# Patient Record
Sex: Female | Born: 1975
Health system: Southern US, Community
[De-identification: ages and names within clinical notes are randomized; demographics above are authoritative.]

## PROBLEM LIST (undated history)

## (undated) DIAGNOSIS — R51 Headache: Secondary | ICD-10-CM

## (undated) DIAGNOSIS — J309 Allergic rhinitis, unspecified: Secondary | ICD-10-CM

## (undated) DIAGNOSIS — E039 Hypothyroidism, unspecified: Secondary | ICD-10-CM

## (undated) HISTORY — PX: OTHER SURGICAL HISTORY: SHX169

## (undated) HISTORY — PX: BREAST BIOPSY: SHX20

## (undated) HISTORY — DX: Headache: R51

## (undated) HISTORY — PX: RHINOPLASTY: SUR1284

## (undated) HISTORY — DX: Allergic rhinitis, unspecified: J30.9

## (undated) HISTORY — DX: Hypothyroidism, unspecified: E03.9

---

## 2007-11-19 ENCOUNTER — Encounter: Payer: Self-pay | Admitting: Internal Medicine

## 2007-12-18 LAB — CONVERTED CEMR LAB: Pap Smear: NEGATIVE

## 2008-03-09 ENCOUNTER — Encounter: Payer: Self-pay | Admitting: Internal Medicine

## 2008-03-09 LAB — CONVERTED CEMR LAB: TSH: 0.75 microintl units/mL

## 2008-05-18 ENCOUNTER — Encounter: Payer: Self-pay | Admitting: Internal Medicine

## 2008-06-29 ENCOUNTER — Encounter: Payer: Self-pay | Admitting: Internal Medicine

## 2008-06-30 ENCOUNTER — Encounter: Payer: Self-pay | Admitting: Internal Medicine

## 2008-11-15 ENCOUNTER — Encounter: Payer: Self-pay | Admitting: Internal Medicine

## 2008-11-15 LAB — CONVERTED CEMR LAB: TSH: 2.62 microintl units/mL

## 2008-11-16 ENCOUNTER — Encounter: Payer: Self-pay | Admitting: Internal Medicine

## 2008-12-28 ENCOUNTER — Encounter: Payer: Self-pay | Admitting: Internal Medicine

## 2008-12-28 LAB — CONVERTED CEMR LAB: TSH: 0.53 microintl units/mL

## 2009-05-02 ENCOUNTER — Encounter: Payer: Self-pay | Admitting: Internal Medicine

## 2009-05-03 ENCOUNTER — Encounter: Payer: Self-pay | Admitting: Internal Medicine

## 2010-02-07 ENCOUNTER — Ambulatory Visit: Payer: Self-pay | Admitting: Internal Medicine

## 2010-02-07 DIAGNOSIS — E039 Hypothyroidism, unspecified: Secondary | ICD-10-CM | POA: Insufficient documentation

## 2010-02-07 LAB — CONVERTED CEMR LAB: TSH: 0.41 microintl units/mL (ref 0.35–5.50)

## 2010-02-14 ENCOUNTER — Encounter: Payer: Self-pay | Admitting: Internal Medicine

## 2010-02-14 DIAGNOSIS — J309 Allergic rhinitis, unspecified: Secondary | ICD-10-CM | POA: Insufficient documentation

## 2010-10-06 ENCOUNTER — Telehealth: Payer: Self-pay | Admitting: Internal Medicine

## 2010-12-12 ENCOUNTER — Ambulatory Visit: Payer: Self-pay | Admitting: Internal Medicine

## 2010-12-13 LAB — CONVERTED CEMR LAB
ALT: 23 units/L (ref 0–35)
AST: 22 units/L (ref 0–37)
Albumin: 4.1 g/dL (ref 3.5–5.2)
Alkaline Phosphatase: 34 units/L — ABNORMAL LOW (ref 39–117)
Basophils Relative: 0.7 % (ref 0.0–3.0)
Bilirubin, Direct: 0.1 mg/dL (ref 0.0–0.3)
CO2: 28 meq/L (ref 19–32)
Calcium: 9.5 mg/dL (ref 8.4–10.5)
Chloride: 103 meq/L (ref 96–112)
Eosinophils Absolute: 0.2 10*3/uL (ref 0.0–0.7)
Glucose, Bld: 81 mg/dL (ref 70–99)
HDL: 54.6 mg/dL (ref >39.00)
Hemoglobin: 13.7 g/dL (ref 12.0–15.0)
Lymphs Abs: 2 10*3/uL (ref 0.7–4.0)
MCHC: 34 g/dL (ref 30.0–36.0)
MCV: 91 fL (ref 78.0–100.0)
Monocytes Absolute: 0.3 10*3/uL (ref 0.1–1.0)
Neutro Abs: 3.4 10*3/uL (ref 1.4–7.7)
RBC: 4.42 M/uL (ref 3.87–5.11)
Total CHOL/HDL Ratio: 3
Total Protein: 6.8 g/dL (ref 6.0–8.3)

## 2010-12-17 HISTORY — PX: RHINOPLASTY: SUR1284

## 2011-01-16 NOTE — Letter (Signed)
Summary: [facility]  [facility]   Imported By: Sherian Rein 02/17/2010 08:21:07  _____________________________________________________________________  External Attachment:    Type:   Image     Comment:   External Document

## 2011-01-16 NOTE — Letter (Signed)
Summary: [facility]  [facility]   Imported By: Sherian Rein 02/17/2010 08:20:13  _____________________________________________________________________  External Attachment:    Type:   Image     Comment:   External Document

## 2011-01-16 NOTE — Assessment & Plan Note (Signed)
Summary: NEW/ [employer] / NWS   Vital Signs:  Patient profile:   35 year old female Height:      62.5 inches (158.75 cm) Weight:      103.8 pounds (47.18 kg) BMI:     18.75 O2 Sat:      98 % on Room air Temp:     98.8 degrees F (37.11 degrees C) oral Pulse rate:   84 / minute BP sitting:   90 / 62  (left arm) Cuff size:   regular  Vitals Entered By: Orlan Leavens (February 07, 2010 8:40 AM)  O2 Flow:  Room air CC: New patient Is Patient Diabetic? No Pain Assessment Patient in pain? no        Primary Care Provider:  Newt Lukes MD  CC:  New patient.  History of Present Illness: new pt to me and our division - here to est care  hx hypothyroidism -  onset was 2009 with pregnancy - followed with endo for same in [place] but moved to [place] last year and not checked since - symptoms have been stable on current med dose - compliant as rx'd no diarrhea or constipation, no weight changes or fatigue denies hair or skin change  Preventive Screening-Counseling & Management  Alcohol-Tobacco     Alcohol drinks/day: 0     Alcohol Counseling: not indicated; patient does not drink     Smoking Status: never     Tobacco Counseling: not indicated; no tobacco use  Caffeine-Diet-Exercise     Does Patient Exercise: yes     Exercise Counseling: not indicated; exercise is adequate     Depression Counseling: not indicated; screening negative for depression  Safety-Violence-Falls     Seat Belt Use: yes     Helmet Use: yes     Firearms in the Home: no firearms in the home     Smoke Detectors: yes     Violence in the Home: no risk noted     Sexual Abuse: no     Fall Risk Counseling: not indicated; no significant falls noted  Clinical Review Panels:  Prevention   Last Pap Smear:  Interpretation/Result:Negative for intraepithelial Lesion or Malignancy.    (12/18/2007)   Current Medications (verified): 1)  Synthroid 88 Mcg Tabs (Levothyroxine Sodium) .... Take 1 By Mouth Qd 2)   Multivitamins  Tabs (Multiple Vitamin) .... Take 1 By Mouth Qd 3)  Calcium 500 Mg Tabs (Calcium) .... Take 1 Every Other Day  Allergies (verified): No Known Drug Allergies  Past History:  Past Medical History: Hypothyroidism  Past Surgical History: child birth x's 2 (06 & 09)  Family History: Family History High cholesterol (mother) Family History Hypertension (mother) Heart disease: Dad with A. 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    Physical Exam  General:  thin, fit, alert, well-developed, well-nourished, and cooperative to examination.    Eyes:  vision grossly intact; pupils equal, round and reactive to light.  conjunctiva and lids normal.    Ears:  normal pinnae bilaterally, without erythema, swelling, or tenderness to palpation. TMs clear, without effusion, or cerumen impaction. Hearing grossly normal bilaterally  Mouth:  teeth and gums in good repair; mucous membranes  moist, without lesions or ulcers. oropharynx clear without exudate, no erythema.  Neck:  supple, full ROM, no masses, no thyromegaly; no thyroid nodules or tenderness. no JVD or carotid bruits.   Lungs:  normal respiratory effort, no intercostal retractions or use of accessory muscles; normal breath sounds bilaterally - no crackles and no wheezes.    Heart:  normal rate, regular rhythm, no murmur, and no rub. BLE without edema. Psych:  Oriented X3, memory intact for recent and remote, normally interactive, good eye contact, not anxious appearing, not depressed appearing, and not agitated.      Impression & Recommendations:  Problem # 1:  HYPOTHYROIDISM (ICD-244.9)  Her updated medication list for this problem  includes:    Synthroid 88 Mcg Tabs (Levothyroxine sodium) .Marland Kitchen... Take 1 by mouth qd  Orders: TLB-TSH (Thyroid Stimulating Hormone) (84443-TSH)  Complete Medication List: 1)  Synthroid 88 Mcg Tabs (Levothyroxine sodium) .... Take 1 by mouth qd 2)  Multivitamins Tabs (Multiple vitamin) .... Take 1 by mouth qd 3)  Calcium 500 Mg Tabs (Calcium) .... Take 1 every other day  Patient Instructions: 1)  it was good to see you today.  2)  will check TSH today - if normal range, will send year supply synthroid to your pharmacy. If abnormal range, will contact you directly 3)  will send for records from endo at [facility] as discussed - 4)  Please schedule a follow-up appointment in 6-12 months to monitor TSH, sooner if problems.    Pap Smear  Procedure date:  12/18/2007  Findings:      Interpretation/Result:Negative for intraepithelial Lesion or Malignancy.

## 2011-01-16 NOTE — Letter (Signed)
Summary: [facility]   Imported By: Sherian Rein 02/17/2010 08:18:41  _____________________________________________________________________  External Attachment:    Type:   Image     Comment:   External Document

## 2011-01-16 NOTE — Progress Notes (Signed)
Summary: levothyroxine  Phone Note Refill Request Message from:  Fax from Pharmacy on October 06, 2010 8:59 AM  Refills Requested: Medication #1:  SYNTHROID 88 MCG TABS take 1 by mouth qd   Last Refilled: 10/06/2010 Karin Golden @ [place]  Initial call taken by: Orlan Leavens RMA,  October 06, 2010 8:59 AM    Prescriptions: SYNTHROID 88 MCG TABS (LEVOTHYROXINE SODIUM) take 1 by mouth qd  #90 x 0   Entered by:   Orlan Leavens RMA   Authorized by:   Newt Lukes MD   Signed by:   Orlan Leavens RMA on 10/06/2010   Method used:   Electronically to        [facility]* (retail)       [address]       [place], [place]  [address]       Ph: [phone]       Fax: [phone]   RxID:   [id number]

## 2011-01-16 NOTE — Letter (Signed)
Summary: [facility]  [facility]   Imported By: Sherian Rein 02/17/2010 08:22:26  _____________________________________________________________________  External Attachment:    Type:   Image     Comment:   External Document

## 2011-01-18 NOTE — Assessment & Plan Note (Signed)
Summary: follow up to refill rx-lb   Vital Signs:  Patient profile:   35 year old female Height:      62.5 inches (158.75 cm) Weight:      105.2 pounds (47.82 kg) BMI:     19.00 O2 Sat:      95 % on Room air Temp:     98.6 degrees F (37.00 degrees C) oral Pulse rate:   69 / minute BP sitting:   100 / 62  (left arm) Cuff size:   regular  Vitals Entered By: Orlan Leavens RMA (December 12, 2010 11:26 AM)  O2 Flow:  Room air CC: follow-up visit Is Patient Diabetic? No Pain Assessment Patient in pain? no        Primary Care Blayden Conwell:  Newt Lukes MD  CC:  follow-up visit.  History of Present Illness: patient is here today for annual physical. Patient feels well and has no complaints.  fasting for l;abs  also review chronic med issues: hx hypothyroid -  onset was 2009 with pregnancy - followed with endo for same in [place] in 2010 - symptoms have been stable on current med dose - compliant as rx'd no diarrhea, but ++ constipation, no weight changes or fatigue denies hair or skin change  Preventive Screening-Counseling & Management  Alcohol-Tobacco     Alcohol drinks/day: 0     Alcohol Counseling: not indicated; patient does not drink     Smoking Status: never     Tobacco Counseling: not indicated; no tobacco use  Caffeine-Diet-Exercise     Does Patient Exercise: yes     Exercise Counseling: not indicated; exercise is adequate     Depression Counseling: not indicated; screening negative for depression  Safety-Violence-Falls     Seat Belt Use: yes     Helmet Use: yes     Firearms in the Home: no firearms in the home     Smoke Detectors: yes     Violence in the Home: no risk noted     Sexual Abuse: no     Fall Risk Counseling: not indicated; no significant falls noted  Clinical Review Panels:  Prevention   Last Pap Smear:  Interpretation/Result:Negative for intraepithelial Lesion or Malignancy.    (12/18/2007)   Current Medications (verified): 1)   Synthroid 88 Mcg Tabs (Levothyroxine Sodium) .... Take 1 By Mouth Once Daily, But On Sun Take 1 Extra in Pm 2)  Multivitamins  Tabs (Multiple Vitamin) .... Take 1 By Mouth Qd 3)  Calcium 500 Mg Tabs (Calcium) .... Take 2 By Mouth Once Daily 4)  Miralax  Powd (Polyethylene Glycol 3350) .... Take 17gm Once Daily 5)  Excedrin Migraine 250-250-65 Mg Tabs (Aspirin-Acetaminophen-Caffeine) .... Use As Needed  Allergies (verified): No Known Drug Allergies  Past History:  Past medical, surgical, family and social histories (including risk factors) reviewed, and no changes noted (except as noted below).  Past Medical History: Hypothyroidism - hashimotos hx Allergic rhinitis  Past Surgical History: Reviewed history from 02/07/2010 and no changes required. child birth x's 2 (06 & 09)  Family History: Reviewed history from 02/07/2010 and no changes required. Family History High cholesterol (mother) Family History Hypertension (mother) Heart disease: Dad with A. Fib and RVR - mom - chol, HTN  Social History: Reviewed history from 02/07/2010 and no changes required. 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  also see HPI above. I have reviewed all other systems and they were negative.   Physical Exam  General:  thin, fit, alert, well-developed, well-nourished, and cooperative to examination.    Head:  Normocephalic and atraumatic without obvious abnormalities. No apparent alopecia or balding. Eyes:  vision grossly intact; pupils equal, round and reactive to light.  conjunctiva and lids normal.    Ears:  normal pinnae bilaterally, without erythema, swelling, or tenderness to palpation. TMs clear, without effusion, or cerumen impaction. Hearing grossly normal bilaterally  Mouth:  teeth and gums in good repair; mucous membranes moist, without lesions or  ulcers. oropharynx clear without exudate, no erythema.  Neck:  supple, full ROM, no masses, no thyromegaly; no thyroid nodules or tenderness. no JVD or carotid bruits.   Lungs:  normal respiratory effort, no intercostal retractions or use of accessory muscles; normal breath sounds bilaterally - no crackles and no wheezes.    Heart:  normal rate, regular rhythm, no murmur, and no rub. BLE without edema. Abdomen:  soft, non-tender, normal bowel sounds, no distention; no masses and no appreciable hepatomegaly or splenomegaly.   Genitalia:  defer gyn Msk:  No deformity or scoliosis noted of thoracic or lumbar spine.   Neurologic:  alert & oriented X3 and cranial nerves II-XII symetrically intact.  strength normal in all extremities, sensation intact to light touch, and gait normal. speech fluent without dysarthria or aphasia; follows commands with good comprehension.  Skin:  no rashes, vesicles, ulcers, or erythema. No nodules or irregularity to palpation.  Psych:  Oriented X3, memory intact for recent and remote, normally interactive, good eye contact, not anxious appearing, not depressed appearing, and not agitated.      Impression & Recommendations:  Problem # 1:  PREVENTIVE HEALTH CARE (ICD-V70.0) Patient has been counseled on age-appropriate routine health concerns for screening and prevention. These are reviewed and up-to-date. Immunizations are up-to-date or declined. Labs ordered and will be reviewed.  Orders: TLB-CBC Platelet - w/Differential (85025-CBCD) TLB-BMP (Basic Metabolic Panel-BMET) (80048-METABOL) TLB-Hepatic/Liver Function Pnl (80076-HEPATIC) TLB-Lipid Panel (80061-LIPID)  Problem # 2:  HYPOTHYROIDISM (ICD-244.9)  reports inc dose over summer by her [facility] endo based on symptoms (heat sens and constipation) recheck labs now - refill at current dose if normal labs Her updated medication list for this problem includes:    Synthroid 100 Mcg Tabs (Levothyroxine sodium) .Marland Kitchen... 1 by  mouth once daily  Orders: TLB-TSH (Thyroid Stimulating Hormone) (84443-TSH) TLB-T4 (Thyrox), Free [phone])  Labs Reviewed: TSH: 0.41 (02/07/2010)     Complete Medication List: 1)  Synthroid 100 Mcg Tabs (Levothyroxine sodium) .Marland Kitchen.. 1 by mouth once daily 2)  Multivitamins Tabs (Multiple vitamin) .... Take 1 by mouth qd 3)  Calcium 500 Mg Tabs (Calcium) .... Take 2 by mouth once daily 4)  Miralax Powd (Polyethylene glycol 3350) .... Take 17gm once daily 5)  Excedrin Migraine 250-250-65 Mg Tabs (Aspirin-acetaminophen-caffeine) .... Use as needed 6)  Align Caps (Probiotic product) .Marland Kitchen.. 1 by mouth once daily  Patient Instructions: 1)  it was good to see you today. 2)  test(s) ordered today - your results will be posted on the phone tree for review in 48-72 hours from the time of test completion; call [phone] and enter your 9 digit MRN (listed above on this page, just below your name); if any changes need to be made or there are abnormal results, you will be contacted directly. 3)  let us know if you need referral to gynecology for PAP in next 12months  4)  if thyroid labs normal, will send refills to [facility] pharmacy as requested 5)  try Align to help with constipation - 7d sample and coupon provided today 6)  Please schedule a follow-up appointment in 6 months to monitor thyroid, call sooner if problems.    Orders Added: 1)  TLB-TSH (Thyroid Stimulating Hormone) [84443-TSH] 2)  TLB-T4 (Thyrox), Free [16109-UE4V] 3)  TLB-CBC Platelet - w/Differential [85025-CBCD] 4)  TLB-BMP (Basic Metabolic Panel-BMET) [80048-METABOL] 5)  TLB-Hepatic/Liver Function Pnl [80076-HEPATIC] 6)  TLB-Lipid Panel [80061-LIPID] 7)  Est. Patient 18-39 years [99395] 8)  Est. Patient Level II [40981]

## 2011-01-24 ENCOUNTER — Other Ambulatory Visit: Payer: Managed Care, Other (non HMO)

## 2011-01-24 ENCOUNTER — Other Ambulatory Visit: Payer: Self-pay | Admitting: Internal Medicine

## 2011-01-24 ENCOUNTER — Ambulatory Visit (INDEPENDENT_AMBULATORY_CARE_PROVIDER_SITE_OTHER): Payer: Managed Care, Other (non HMO) | Admitting: Internal Medicine

## 2011-01-24 ENCOUNTER — Encounter: Payer: Self-pay | Admitting: Internal Medicine

## 2011-01-24 ENCOUNTER — Ambulatory Visit: Payer: Self-pay | Admitting: Internal Medicine

## 2011-01-24 DIAGNOSIS — R51 Headache: Secondary | ICD-10-CM

## 2011-01-24 DIAGNOSIS — R519 Headache, unspecified: Secondary | ICD-10-CM | POA: Insufficient documentation

## 2011-01-24 LAB — SEDIMENTATION RATE: Sed Rate: 9 mm/hr (ref 0–22)

## 2011-01-26 ENCOUNTER — Encounter: Payer: Self-pay | Admitting: Internal Medicine

## 2011-01-29 ENCOUNTER — Other Ambulatory Visit: Payer: Self-pay | Admitting: Internal Medicine

## 2011-01-29 DIAGNOSIS — R51 Headache: Secondary | ICD-10-CM

## 2011-02-01 ENCOUNTER — Other Ambulatory Visit (HOSPITAL_COMMUNITY): Payer: Managed Care, Other (non HMO)

## 2011-02-01 NOTE — Assessment & Plan Note (Signed)
Summary: headaches   Vital Signs:  Patient profile:   35 year old female Height:      62.5 inches (158.75 cm) O2 Sat:      96 % on Room air Temp:     98.4 degrees F (36.89 degrees C) oral Pulse rate:   69 / minute BP sitting:   100 / 60  (left arm) Cuff size:   regular  Vitals Entered By: Orlan Leavens RMA (January 24, 2011 9:16 AM)  O2 Flow:  Room air CC: Headaches Is Patient Diabetic? No Pain Assessment Patient in pain? no        Primary Care Provider:  Newt Lukes MD  CC:  Headaches.  History of Present Illness:  Headaches      This is a 35 year old woman who presents with Headaches.  The symptoms began >12 months ago.  On a scale of 1 to 10, the intensity is described as a 5.  different than usual migraine headaches. present every AM upon waking.  The patient denies nausea, vomiting, sweats, tearing of eyes, nasal congestion, sinus pain, sinus pressure, photophobia, and phonophobia.  The headache is described as intermittent and dull.  The location of the pain is unilateral on the left.  The patient denies the following high-risk features: fever, neck pain/stiffness, vision loss or change, focal weakness, altered mental status, rash, trauma, pain worse with exertion, age >35 years, and immunosuppression.  The headaches are precipitated by stress.  Prior treatment has included a NSAID and a muscle relaxer.     also reviewed chronic med issues: hx hypothyroid -  onset was 2009 with pregnancy - followed with endo for same in [place] in 2010 - symptoms have been stable on current med dose - compliant as rx'd no diarrhea, but ++ constipation, no weight changes or fatigue denies hair or skin change  Preventive Screening-Counseling & Management  Alcohol-Tobacco     Alcohol drinks/day: 0     Alcohol Counseling: not indicated; patient does not drink     Smoking Status: never     Tobacco Counseling: not indicated; no tobacco use  Caffeine-Diet-Exercise     Does Patient  Exercise: yes     Exercise Counseling: not indicated; exercise is adequate     Depression Counseling: not indicated; screening negative for depression  Clinical Review Panels:  CBC   WBC:  6.0 (12/12/2010)   RBC:  4.42 (12/12/2010)   Hgb:  13.7 (12/12/2010)   Hct:  40.2 (12/12/2010)   Platelets:  306.0 (12/12/2010)   MCV  91.0 (12/12/2010)   MCHC  34.0 (12/12/2010)   RDW  12.8 (12/12/2010)   PMN:  56.5 (12/12/2010)   Lymphs:  33.1 (12/12/2010)   Monos:  5.6 (12/12/2010)   Eosinophils:  4.1 (12/12/2010)   Basophil:  0.7 (12/12/2010)  Complete Metabolic Panel   Glucose:  81 (12/12/2010)   Sodium:  138 (12/12/2010)   Potassium:  4.4 (12/12/2010)   Chloride:  103 (12/12/2010)   CO2:  28 (12/12/2010)   BUN:  16 (12/12/2010)   Creatinine:  0.7 (12/12/2010)   Albumin:  4.1 (12/12/2010)   Total Protein:  6.8 (12/12/2010)   Calcium:  9.5 (12/12/2010)   Total Bili:  0.6 (12/12/2010)   Alk Phos:  34 (12/12/2010)   SGPT (ALT):  23 (12/12/2010)   SGOT (AST):  22 (12/12/2010)   Current Medications (verified): 1)  Synthroid 100 Mcg Tabs (Levothyroxine Sodium) .Marland Kitchen.. 1 By Mouth Once Daily 2)  Multivitamins  Tabs (  Multiple Vitamin) .... Take 1 By Mouth Qd 3)  Calcium 500 Mg Tabs (Calcium) .... Take 2 By Mouth Once Daily 4)  Miralax  Powd (Polyethylene Glycol 3350) .... Take 17gm Once Daily 5)  Excedrin Migraine 250-250-65 Mg Tabs (Aspirin-Acetaminophen-Caffeine) .... Take 2 By Mouth Once Daily 6)  Align  Caps (Probiotic Product) .Marland Kitchen.. 1 By Mouth Once Daily 7)  Ibuprofen 200 Mg Caps (Ibuprofen) .... Take 1 By Mouth Once Daily  Allergies (verified): No Known Drug Allergies  Past History:  Past Medical History: Hypothyroidism - hashimotos hx Allergic rhinitis  Review of Systems  The patient denies weight loss, vision loss, chest pain, dyspnea on exertion, hemoptysis, incontinence, muscle weakness, suspicious skin lesions, and enlarged lymph nodes.    Physical Exam  General:   thin, fit, alert, well-developed, well-nourished, and cooperative to examination.    Lungs:  normal respiratory effort, no intercostal retractions or use of accessory muscles; normal breath sounds bilaterally - no crackles and no wheezes.    Heart:  normal rate, regular rhythm, no murmur, and no rub. BLE without edema. Neurologic:  alert & oriented X3 and cranial nerves II-XII symetrically intact.  strength normal in all extremities, sensation intact to light touch, and gait normal. speech fluent without dysarthria or aphasia; follows commands with good comprehension.    Impression & Recommendations:  Problem # 1:  HEADACHE (ICD-784.0)  suspect stress/tension related - prev trial tx robaxin ineffective - different than usual migrain symptoms - relieved with NSAIDs - ?rebound complications - neuro exam benign but check head ct r/o ic abn - also sed rate now - other cpx labs reviewed and normal  Her updated medication list for this problem includes:    Excedrin Migraine 250-250-65 Mg Tabs (Aspirin-acetaminophen-caffeine) .Marland Kitchen... Take 2 by mouth once daily    Ibuprofen 200 Mg Caps (Ibuprofen) .Marland Kitchen... Take 1 by mouth once daily  Orders: Misc. Referral (Misc. Ref) TLB-Sedimentation Rate (ESR) (85652-ESR)  Headache diary reviewed.  Complete Medication List: 1)  Synthroid 100 Mcg Tabs (Levothyroxine sodium) .Marland Kitchen.. 1 by mouth once daily 2)  Multivitamins Tabs (Multiple vitamin) .... Take 1 by mouth qd 3)  Calcium 500 Mg Tabs (Calcium) .... Take 2 by mouth once daily 4)  Miralax Powd (Polyethylene glycol 3350) .... Take 17gm once daily 5)  Excedrin Migraine 250-250-65 Mg Tabs (Aspirin-acetaminophen-caffeine) .... Take 2 by mouth once daily 6)  Align Caps (Probiotic product) .Marland Kitchen.. 1 by mouth once daily 7)  Ibuprofen 200 Mg Caps (Ibuprofen) .... Take 1 by mouth once daily  Patient Instructions: 1)  it was good to see you today. 2)  we'll make referral for head ct. Our office will contact you  regarding this appointment once made.  3)  test(s) ordered today - your results will be posted on the phone tree for review in 48-72 hours from the time of test completion; call [phone] and enter your 9 digit MRN (listed above on this page, just below your name); if any changes need to be made or there are abnormal results, you will be contacted directly.  4)  Please keep scheduled follow-up appointment for thyroid check, sooner if problems.    Orders Added: 1)  Misc. Referral [Misc. Ref] 2)  TLB-Sedimentation Rate (ESR) [85652-ESR] 3)  Est. Patient Level IV [09811]

## 2011-02-02 ENCOUNTER — Encounter: Payer: Self-pay | Admitting: Internal Medicine

## 2011-02-02 ENCOUNTER — Other Ambulatory Visit: Payer: Self-pay | Admitting: Internal Medicine

## 2011-02-02 ENCOUNTER — Ambulatory Visit (HOSPITAL_COMMUNITY)
Admission: RE | Admit: 2011-02-02 | Discharge: 2011-02-02 | Disposition: A | Discharge status: Home / Self Care | Payer: Managed Care, Other (non HMO) | Source: Ambulatory Visit | Attending: Internal Medicine | Admitting: Internal Medicine

## 2011-02-02 DIAGNOSIS — R51 Headache: Secondary | ICD-10-CM | POA: Insufficient documentation

## 2011-02-02 DIAGNOSIS — J3489 Other specified disorders of nose and nasal sinuses: Secondary | ICD-10-CM | POA: Insufficient documentation

## 2011-02-06 ENCOUNTER — Telehealth: Payer: Self-pay | Admitting: Internal Medicine

## 2011-02-07 NOTE — Medication Information (Signed)
[facility]: [facility]   Imported By: Lester Eden 01/31/2011 10:56:18  _____________________________________________________________________  External Attachment:    Type:   Image     Comment:   External Document

## 2011-02-09 ENCOUNTER — Telehealth (INDEPENDENT_AMBULATORY_CARE_PROVIDER_SITE_OTHER): Payer: Self-pay | Admitting: *Deleted

## 2011-02-13 NOTE — Progress Notes (Signed)
  Phone Note Other Incoming   Request: Send information Summary of Call: Request for records received from [facility]. Request forwarded to [facility]. 5 yrs.

## 2011-02-13 NOTE — Progress Notes (Signed)
Summary: CT Head w/o contrast  Phone Note Outgoing Call   Call placed by: Orlan Leavens RMA,  February 06, 2011 8:51 AM Call placed to: Patient Summary of Call: MD recieved CT Head report. Per md no incranial abnormalties seen on CT Head contrast. No med changes at this time. called pt to inform did not pick up [facility] RTC concerning CT Initial call taken by: Orlan Leavens RMA,  February 06, 2011 8:52 AM  Follow-up for Phone Call        Notified pt with results. Sending to be scanned Follow-up by: Orlan Leavens RMA,  February 06, 2011 4:08 PM

## 2011-06-19 ENCOUNTER — Emergency Department (HOSPITAL_COMMUNITY)
Admission: EM | Admit: 2011-06-19 | Discharge: 2011-06-19 | Disposition: A | Discharge status: Home / Self Care | Payer: Managed Care, Other (non HMO) | Attending: Emergency Medicine | Admitting: Emergency Medicine

## 2011-06-19 DIAGNOSIS — R197 Diarrhea, unspecified: Secondary | ICD-10-CM | POA: Insufficient documentation

## 2011-06-19 DIAGNOSIS — E860 Dehydration: Secondary | ICD-10-CM | POA: Insufficient documentation

## 2011-06-19 DIAGNOSIS — E039 Hypothyroidism, unspecified: Secondary | ICD-10-CM | POA: Insufficient documentation

## 2011-07-31 ENCOUNTER — Encounter: Payer: Self-pay | Admitting: Internal Medicine

## 2011-10-04 ENCOUNTER — Other Ambulatory Visit: Payer: Self-pay | Admitting: Internal Medicine

## 2011-11-15 ENCOUNTER — Other Ambulatory Visit (INDEPENDENT_AMBULATORY_CARE_PROVIDER_SITE_OTHER): Payer: Managed Care, Other (non HMO)

## 2011-11-15 ENCOUNTER — Encounter: Payer: Self-pay | Admitting: Internal Medicine

## 2011-11-15 ENCOUNTER — Ambulatory Visit (INDEPENDENT_AMBULATORY_CARE_PROVIDER_SITE_OTHER): Payer: Managed Care, Other (non HMO) | Admitting: Internal Medicine

## 2011-11-15 ENCOUNTER — Other Ambulatory Visit: Payer: Self-pay | Admitting: Internal Medicine

## 2011-11-15 DIAGNOSIS — R05 Cough: Secondary | ICD-10-CM

## 2011-11-15 DIAGNOSIS — E039 Hypothyroidism, unspecified: Secondary | ICD-10-CM

## 2011-11-15 DIAGNOSIS — R059 Cough, unspecified: Secondary | ICD-10-CM

## 2011-11-15 MED ORDER — HYDROCOD POLST-CHLORPHEN POLST 10-8 MG/5ML PO LQCR: 5.0000 mL | Freq: Every evening | ORAL | Status: DC | PRN

## 2011-11-15 MED ORDER — LEVOTHYROXINE SODIUM 88 MCG PO TABS: 88.0000 ug | ORAL_TABLET | ORAL | Status: DC

## 2011-11-15 MED ORDER — BENZONATATE 200 MG PO CAPS: 200.0000 mg | ORAL_CAPSULE | Freq: Three times a day (TID) | ORAL | Status: AC | PRN

## 2011-11-15 MED ORDER — LEVOTHYROXINE SODIUM 100 MCG PO TABS: 100.0000 ug | ORAL_TABLET | Freq: Every day | ORAL | Status: DC

## 2011-11-15 NOTE — Assessment & Plan Note (Signed)
The current medical regimen is effective;  continue present plan and medications. Lab Results  Component Value Date   TSH 0.17* 12/12/2010

## 2011-11-15 NOTE — Patient Instructions (Signed)
It was good to see you today. Test(s) ordered today. Your results will be called to you after review (48-72hours after test completion). If any changes need to be made, you will be notified at that time. Use Tussionex at night, Tessalon during day and Pepcid every day for 2 weeks to help suppress cough symptoms If you develop worsening symptoms or fever, call and we can reconsider antibiotics, but it does not appear necessary to use antibiotics at this time. Please schedule followup in 6-12 months for thyroid recheck, call sooner if problems.

## 2011-11-15 NOTE — Progress Notes (Signed)
  Subjective:    Patient ID: Cynthia Ramsey, female    DOB: [birth year], 35 y.o.   MRN: [id number]  HPI  Here for follow up - reviewed chronic medical issues:   hypothyroid -  onset was 2009 with pregnancy -  followed with endo for same in [place] in 2010 -  symptoms have been stable on current med dose - compliant as rx'd  no diarrhea, but ++ constipation, no weight changes or fatigue  denies hair or skin change   Complains of ongoing cough, nonproductive Precipitated by URI Not relieved with Mucinex or other over-the-counter medications No fever, no shortness of breath Taking antibiotics (ofloxacin) from [place] at this time  Past Medical History  Diagnosis Date  . ALLERGIC RHINITIS   . Headache   . HYPOTHYROIDISM     Hashimoto's history    Review of Systems  Constitutional: Negative for fever and unexpected weight change.  Respiratory: Negative for chest tightness and shortness of breath.   Cardiovascular: Negative for palpitations and leg swelling.       Objective:   Physical Exam BP 100/60  Pulse 75  Temp(Src) 98.5 F (36.9 C) (Oral)  Ht 5\' 2"  (1.575 m)  Wt 108 lb 6.4 oz (49.17 kg)  BMI 19.83 kg/m2  SpO2 97% Wt Readings from Last 3 Encounters:  11/15/11 108 lb 6.4 oz (49.17 kg)  12/12/10 105 lb 3.2 oz (47.718 kg)  02/07/10 103 lb 12.8 oz (47.083 kg)   Constitutional: She appears well-developed and well-nourished. No distress.  Neck: Normal range of motion. Neck supple. No JVD present. No thyromegaly present.  Cardiovascular: Normal rate, regular rhythm and normal heart sounds.  No murmur heard. No BLE edema. Pulmonary/Chest: Effort normal and breath sounds normal. No respiratory distress. She has no wheezes.  Skin: Skin is warm and dry. No rash noted. No erythema.  Psychiatric: She has a normal mood and affect. Her behavior is normal. Judgment and thought content normal.    Lab Results  Component Value Date   WBC 6.0 12/12/2010   HGB 13.7 12/12/2010   HCT  40.2 12/12/2010   PLT 306.0 12/12/2010   GLUCOSE 81 12/12/2010   CHOL 178 12/12/2010   TRIG 57.0 12/12/2010   HDL 54.60 12/12/2010   LDLCALC 112* 12/12/2010   ALT 23 12/12/2010   AST 22 12/12/2010   NA 138 12/12/2010   K 4.4 12/12/2010   CL 103 12/12/2010   CREATININE 0.7 12/12/2010   BUN 16 12/12/2010   CO2 28 12/12/2010   TSH 0.17* 12/12/2010        Assessment & Plan:  See problem list. Medications and labs reviewed today.  Cough, post viral. No role for antibiotics indicated. Will treat with aggressive cough suppression including H2 blocker, Tessalon and Tessalon syrup at night. Exam and vital signs benign. Patient to call if symptoms unimproved or worse in the next 3 weeks

## 2011-11-25 ENCOUNTER — Telehealth: Payer: Self-pay | Admitting: Family Medicine

## 2011-11-25 NOTE — Telephone Encounter (Signed)
[facility] called to report pt has 24h hx of f/c achiness, cough--essentially flu-like symptoms.  Pt calling to request tamiflu rx. Pt is a hospitalist, has had recent frequent influenza + contact.  Currently doing appropriate home symptomatic care per nurse. I did authorize rx for Tamiflu 75mg  caps, 1 bid x 5d, #10, no RF.

## 2011-11-26 ENCOUNTER — Telehealth: Payer: Self-pay

## 2011-11-26 NOTE — Telephone Encounter (Signed)
[facility] Triage Call Report Triage Record Num: [id number] Operator: Baldomero Lamy Patient Name: Cynthia Ramsey Call Date & Time: 11/25/2011 9:56:57AM Patient Phone: PCP: Rene Paci Patient Gender: Female PCP Fax : [phone] Patient DOB: October 19, 1976 Practice Name: [facility] Reason for Call: Caller: Dr. Emiliano Dyer; PCP: Rene Paci; CB#: [phone]; Call Reason: Patient Exposed To Flu, Cough, Chills, Body Aches; Sx Onset: 11/24/2011; Sx Notes: ; Afebrile; Wt: ; Guideline Used: ; Disp:; Appt Scheduled?: Pt calling requesting Tamiflu. 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 SXS: body aches, chills, sore throat, cold sxs, fever-Onset 12/8 afternoon. Afebrile at the moment but comes and goes. Pt staying hydrated and resting. Tx with alternating 2 Tylenol ant 2 Ibuprofen Q 6-8 hrs. Emergent sxs of Flu-Like sxs r/o. Disp: Call Provider w/in 4 hrs for Flu-like sxs associated with a cough and breathing that is becoming more more difficult. Spoke with Dr. Milinda Cave; T.O. for Tamilflu 75 mg: 1 tab bid x 5 Days; no refills. Spoke with Brandy at [facility] [phone] Protocol(s) Used: Flu-Like Symptoms Recommended Outcome per Protocol: Call Provider within 4 Hours Reason for Outcome: Flu-like symptoms associated with a cough and breathing that is becoming more difficult Care Advice: ~ Use a cool mist humidifier to moisten air. Be sure to clean according to manufacturer's instructions. ~ Rest until symptoms improve. ~ Consider use of a saline nasal spray per package directions to help relieve nasal congestion. Aspirin should not be used in anyone, 35 years of age and under, for temperature control, pain, or aches when flu is a possibility. ~ ~ If you can, stop smoking now and avoid all secondhand smoke. ~ SYMPTOM / CONDITION MANAGEMENT ~ IMMEDIATE ACTION ~ INFECTION CONTROL ~ CAUTIONS Coughing up mucus or phlegm helps to  get rid of an infection. A productive cough should not be stopped. A cough medicine with guaifenesin (Robitussin, Mucinex) can help loosen the mucus. Cough medicine with dextromethorphan (DM) should be avoided. Drinking lots of fluids can help loosen the mucus too, especially warm fluids. ~ If your provider has not called back within 4 hours to discuss your symptoms and recommend the appropriate care for your situation, see a provider immediately. ~ ~ Follow public health advisories. Most adults need to drink 6-10 eight-ounce glasses (1.2-2.0 liters) of fluids per day unless previously told to limit fluid intake for other medical reasons. Limit fluids that contain caffeine, sugar or alcohol. Urine will be a very light yellow color when you drink enough fluids. ~ Analgesic/Antipyretic Advice - Acetaminophen: Consider acetaminophen as directed on label or by pharmacist/provider for pain or fever. PRECAUTIONS: - Use only if there is no history of liver disease, alcoholism, or intake of three or more alcohol drinks per day. - If approved by provider when breastfeeding. - Do not exceed recommended dose or frequency. ~ 11/25/2011 12:28:27PM Page 1 of 3 [id number] [facility] Triage Call Report Patient Name: Cynthia Ramsey continuation page/s Sore Throat Relief: - Use warm salt water gargles 3 to 4 times/day, as needed (1/2 tsp. salt in 8 oz. [.2 liters] water). - Suck on hard candy, nonprescription or herbal throat lozenges (sugar-free if diabetic) - Eat soothing, soft food/fluids (broths, soups, or honey and lemon juice in hot tea, Popsicles, frozen yogurt or sherbet, scrambled eggs, cooked cereals, Jell-O or puddings) whichever is most comforting. - Avoid eating salty, spicy or acidic foods. ~ Analgesic/Antipyretic Advice - NSAIDs: Consider aspirin, ibuprofen, naproxen or ketoprofen  for pain or fever as directed on label or by pharmacist/provider. PRECAUTIONS: - If over 12 years  of age, should not take longer than 1 week without consulting provider. EXCEPTIONS: - Should not be used if taking blood thinners or have bleeding problems. - Do not use if have history of sensitivity/allergy to any of these medications; or history of cardiovascular, ulcer, kidney, liver disease or diabetes unless approved by provider. - Do not exceed recommended dose or frequency. ~ See a provider immediately or go to the Emergency Department if having chest pain with breathing, change in level of consciousness, new seizure, vomiting and unable to keep fluids down for 8 hours or more, or has not urinated for 8 or more hours. ~ Influenza - Expected Course: - Symptoms start to improve in 3 to 7 days. - Cough and feeling tired (malaise) may continue for several weeks. - Cough and extreme fatigue lasting more than 3 weeks needs medical evaluation. - Remain at home at least 24 hours after being free of fever 100F (37.8C) without the use of fever reducing medication. ~ Influenza - Transmission: - Flu virus is spread through the air in droplets when a flu-infected person coughs, sneezes or talks and another person breathes in the droplets, or by touching a surface like a door knob, telephone, or keyboard that has been contaminated by the droplets and then touching the mouth, nose, or eyes. - An individual may be passing on the flu before they have any symptoms. - An individual may continue to be contagious with the flu for up to 7 days after the symptoms start. H1N1 influenza may continue to be contagious as long as cough persists. ~ Influenza Respiratory Hygiene: - Cover the nose/mouth tightly with a tissue when coughing or sneezing. - Use tissue one time and discard in the nearest waste receptacle. - Wash hands with soap and water or alcohol-based hand rub for at least 15 seconds after coming into contact

## 2011-11-26 NOTE — Telephone Encounter (Signed)
Noted. Thanks.

## 2012-01-23 ENCOUNTER — Encounter: Payer: Self-pay | Admitting: Internal Medicine

## 2012-01-23 ENCOUNTER — Ambulatory Visit (INDEPENDENT_AMBULATORY_CARE_PROVIDER_SITE_OTHER): Payer: Managed Care, Other (non HMO) | Admitting: Internal Medicine

## 2012-01-23 ENCOUNTER — Other Ambulatory Visit (INDEPENDENT_AMBULATORY_CARE_PROVIDER_SITE_OTHER): Payer: Managed Care, Other (non HMO)

## 2012-01-23 ENCOUNTER — Ambulatory Visit (INDEPENDENT_AMBULATORY_CARE_PROVIDER_SITE_OTHER)
Admission: RE | Admit: 2012-01-23 | Discharge: 2012-01-23 | Disposition: A | Discharge status: Home / Self Care | Payer: Managed Care, Other (non HMO) | Source: Ambulatory Visit | Attending: Internal Medicine | Admitting: Internal Medicine

## 2012-01-23 VITALS — BP 90/58 | HR 62 | Temp 97.3°F | Wt 111.0 lb

## 2012-01-23 DIAGNOSIS — E039 Hypothyroidism, unspecified: Secondary | ICD-10-CM

## 2012-01-23 DIAGNOSIS — F419 Anxiety disorder, unspecified: Secondary | ICD-10-CM

## 2012-01-23 DIAGNOSIS — R059 Cough, unspecified: Secondary | ICD-10-CM

## 2012-01-23 DIAGNOSIS — R0989 Other specified symptoms and signs involving the circulatory and respiratory systems: Secondary | ICD-10-CM

## 2012-01-23 DIAGNOSIS — R0600 Dyspnea, unspecified: Secondary | ICD-10-CM

## 2012-01-23 DIAGNOSIS — F411 Generalized anxiety disorder: Secondary | ICD-10-CM

## 2012-01-23 DIAGNOSIS — R05 Cough: Secondary | ICD-10-CM

## 2012-01-23 DIAGNOSIS — R0609 Other forms of dyspnea: Secondary | ICD-10-CM

## 2012-01-23 LAB — CBC WITH DIFFERENTIAL/PLATELET
Basophils Relative: 1.1 % (ref 0.0–3.0)
Eosinophils Absolute: 0.2 10*3/uL (ref 0.0–0.7)
Eosinophils Relative: 4 % (ref 0.0–5.0)
Hemoglobin: 13.1 g/dL (ref 12.0–15.0)
Lymphocytes Relative: 34.7 % (ref 12.0–46.0)
MCHC: 34 g/dL (ref 30.0–36.0)
MCV: 89.4 fl (ref 78.0–100.0)
Neutro Abs: 2.8 10*3/uL (ref 1.4–7.7)
RBC: 4.3 Mil/uL (ref 3.87–5.11)
WBC: 5.4 10*3/uL (ref 4.5–10.5)

## 2012-01-23 MED ORDER — IOHEXOL 300 MG/ML  SOLN
80.0000 mL | Freq: Once | INTRAMUSCULAR | Status: AC | PRN
  Administered 2012-01-23: 80 mL via INTRAVENOUS

## 2012-01-23 MED ORDER — LEVOTHYROXINE SODIUM 100 MCG PO TABS: 100.0000 ug | ORAL_TABLET | Freq: Every day | ORAL | Status: DC

## 2012-01-23 NOTE — Patient Instructions (Signed)
It was good to see you today. Test(s) ordered today. Your results will be called to you after review (48-72hours after test completion). If any changes need to be made, you will be notified at that time. we'll make referral for CT chest angio to exclude PE. Our office will contact you regarding appointment(s) once made. Once results reviewed, you will be called and we will determine next best med treatment for your cough and anxiety symptoms

## 2012-01-23 NOTE — Progress Notes (Signed)
  Subjective:    Patient ID: Cynthia Ramsey, female    DOB: [birth year], 36 y.o.   MRN: [id number]  HPI Complains of cough symptoms Precipitated by upper respiratory infection symptoms greater than one month ago Not improved by empiric antibiotic course x2 - Augmentin and floor quinolone therapy Also little relief with over-the-counter medications Cough associated with minimal sputum production, shortness of breath and fatigue Denies wheeze or history of asthma/pulmonary disease Periodic sore throat and chest tightness Denies fever or lower extremity swelling Concern about risk for blood clot given extensive travel overseas and within [place] over past 4 weeks  PMH reviewed and updated today  Review of Systems  Constitutional: Negative for chills and unexpected weight change.  HENT: Negative for sneezing, postnasal drip and sinus pressure.   Respiratory: Negative for apnea and stridor.   Cardiovascular: Negative for chest pain, palpitations and leg swelling.       Objective:   Physical Exam BP 90/58  Pulse 62  Temp(Src) 97.3 F (36.3 C) (Oral)  Wt 111 lb (50.349 kg)  SpO2 98% Wt Readings from Last 3 Encounters:  01/23/12 111 lb (50.349 kg)  11/15/11 108 lb 6.4 oz (49.17 kg)  12/12/10 105 lb 3.2 oz (47.718 kg)   Constitutional: She appears well-developed and well-nourished. No distress.  Neck: Normal range of motion. Neck supple. No JVD present. No thyromegaly present.  Cardiovascular: Normal rate, regular rhythm and normal heart sounds.  No murmur heard. No BLE edema. Pulmonary/Chest: Effort normal and breath sounds normal. No respiratory distress. She has no wheezes.  Psychiatric: She has an anxious mood and affect. Her behavior is normal. Judgment and thought content normal.   Lab Results  Component Value Date   WBC 6.0 12/12/2010   HGB 13.7 12/12/2010   HCT 40.2 12/12/2010   PLT 306.0 12/12/2010   GLUCOSE 81 12/12/2010   CHOL 178 12/12/2010   TRIG 57.0 12/12/2010   HDL 54.60 12/12/2010   LDLCALC 112* 12/12/2010   ALT 23 12/12/2010   AST 22 12/12/2010   NA 138 12/12/2010   K 4.4 12/12/2010   CL 103 12/12/2010   CREATININE 0.7 12/12/2010   BUN 16 12/12/2010   CO2 28 12/12/2010   TSH 0.12* 11/15/2011       Assessment & Plan:  Dyspnea with cough x4 weeks - question postinflammatory bronchitis following URI versus other Extensive airline travel over past month confers increased risk DVT Anxiety/PTSD -   Check CT angio chest rule out PE or other bronchitic/pulmonary disease process No evidence of active infection status post 2 rounds empiric antibiotics without improvement in symptoms Consider steroid treatment for postinflammatory process versus PFTs Cough suppression prescription if no evidence of DVT or infection on imaging Also check labs today

## 2012-01-23 NOTE — Assessment & Plan Note (Signed)
The current medical regimen is effective;  continue present plan and medications. Lab Results  Component Value Date   TSH 0.19* 01/23/2012    Addendum: Due to anxiety with slight depression of TSH, recommend slight decrease in TSH dosing and recheck in 6-12 weeks

## 2012-01-24 ENCOUNTER — Telehealth: Payer: Self-pay | Admitting: *Deleted

## 2012-01-24 ENCOUNTER — Other Ambulatory Visit: Payer: Self-pay | Admitting: Internal Medicine

## 2012-01-24 DIAGNOSIS — E039 Hypothyroidism, unspecified: Secondary | ICD-10-CM

## 2012-01-24 MED ORDER — HYDROCOD POLST-CHLORPHEN POLST 10-8 MG/5ML PO LQCR: 5.0000 mL | Freq: Every evening | ORAL | Status: DC | PRN

## 2012-01-24 MED ORDER — PREDNISONE (PAK) 10 MG PO TABS: 10.0000 mg | ORAL_TABLET | ORAL | Status: AC

## 2012-01-24 NOTE — Telephone Encounter (Signed)
Called pharmacy spoke with jessica/pharmacist she states they never received levothyroxine 88 mcg script. Gave verbal order, and she states the pred-pack they have to order pt will pick -up tomorrow... 01/24/12@2 :47pm/LMB

## 2012-01-24 NOTE — Telephone Encounter (Signed)
Called [facility] to verify pt prescriptions that was sent in electronically. Pt spoke with Dr. Felicity Coyer didn't have synthroid 88 mcg that was sent. Called pharmacy received vm pharmacist @ lunch will re-open @ 2:30pm/LMB

## 2012-07-02 ENCOUNTER — Other Ambulatory Visit: Payer: Self-pay | Admitting: Internal Medicine

## 2012-10-30 ENCOUNTER — Other Ambulatory Visit: Payer: Self-pay | Admitting: Internal Medicine

## 2012-12-30 ENCOUNTER — Other Ambulatory Visit: Payer: Self-pay | Admitting: Internal Medicine

## 2012-12-31 ENCOUNTER — Other Ambulatory Visit: Payer: Self-pay | Admitting: Internal Medicine

## 2013-01-26 ENCOUNTER — Other Ambulatory Visit: Payer: Self-pay | Admitting: Internal Medicine

## 2013-02-04 ENCOUNTER — Other Ambulatory Visit: Payer: Self-pay | Admitting: Internal Medicine

## 2013-02-23 ENCOUNTER — Other Ambulatory Visit (INDEPENDENT_AMBULATORY_CARE_PROVIDER_SITE_OTHER): Payer: Managed Care, Other (non HMO)

## 2013-02-23 ENCOUNTER — Encounter: Payer: Self-pay | Admitting: Internal Medicine

## 2013-02-23 ENCOUNTER — Ambulatory Visit (INDEPENDENT_AMBULATORY_CARE_PROVIDER_SITE_OTHER): Payer: Managed Care, Other (non HMO) | Admitting: Internal Medicine

## 2013-02-23 VITALS — BP 98/62 | HR 66 | Temp 97.4°F | Ht 62.0 in | Wt 112.0 lb

## 2013-02-23 DIAGNOSIS — K5909 Other constipation: Secondary | ICD-10-CM

## 2013-02-23 DIAGNOSIS — K5900 Constipation, unspecified: Secondary | ICD-10-CM

## 2013-02-23 DIAGNOSIS — Z124 Encounter for screening for malignant neoplasm of cervix: Secondary | ICD-10-CM

## 2013-02-23 DIAGNOSIS — E039 Hypothyroidism, unspecified: Secondary | ICD-10-CM

## 2013-02-23 DIAGNOSIS — Z0000 Encounter for general adult medical examination without abnormal findings: Secondary | ICD-10-CM

## 2013-02-23 LAB — URINALYSIS, ROUTINE W REFLEX MICROSCOPIC
Specific Gravity, Urine: >=1.03 (ref 1.000–1.030)
Total Protein, Urine: NEGATIVE
Urine Glucose: NEGATIVE

## 2013-02-23 LAB — CBC WITH DIFFERENTIAL/PLATELET
Basophils Relative: 1.2 % (ref 0.0–3.0)
Eosinophils Relative: 2.5 % (ref 0.0–5.0)
Hemoglobin: 12.9 g/dL (ref 12.0–15.0)
Lymphocytes Relative: 30.3 % (ref 12.0–46.0)
MCV: 89.1 fl (ref 78.0–100.0)
Neutrophils Relative %: 59.6 % (ref 43.0–77.0)
RBC: 4.31 Mil/uL (ref 3.87–5.11)
WBC: 5.9 10*3/uL (ref 4.5–10.5)

## 2013-02-23 LAB — BASIC METABOLIC PANEL
Calcium: 9 mg/dL (ref 8.4–10.5)
Chloride: 107 mEq/L (ref 96–112)
Creatinine, Ser: 0.8 mg/dL (ref 0.4–1.2)
Sodium: 138 mEq/L (ref 135–145)

## 2013-02-23 LAB — TSH: TSH: 0.15 u[IU]/mL — ABNORMAL LOW (ref 0.35–5.50)

## 2013-02-23 LAB — MAGNESIUM: Magnesium: 1.9 mg/dL (ref 1.5–2.5)

## 2013-02-23 LAB — LIPID PANEL: VLDL: 12.6 mg/dL (ref 0.0–40.0)

## 2013-02-23 LAB — HEPATIC FUNCTION PANEL
ALT: 16 U/L (ref 0–35)
AST: 18 U/L (ref 0–37)
Bilirubin, Direct: 0.1 mg/dL (ref 0.0–0.3)
Total Bilirubin: 0.5 mg/dL (ref 0.3–1.2)

## 2013-02-23 MED ORDER — LUBIPROSTONE 24 MCG PO CAPS: 24.0000 ug | ORAL_CAPSULE | Freq: Two times a day (BID) | ORAL | Status: DC

## 2013-02-23 MED ORDER — LEVOTHYROXINE SODIUM 88 MCG PO TABS: 88.0000 ug | ORAL_TABLET | ORAL | Status: DC

## 2013-02-23 MED ORDER — VITAMIN D 50 MCG (2000 UT) PO TABS: 2000.0000 [IU] | ORAL_TABLET | Freq: Every day | ORAL | Status: AC

## 2013-02-23 MED ORDER — LEVOTHYROXINE SODIUM 100 MCG PO TABS: 100.0000 ug | ORAL_TABLET | Freq: Every day | ORAL | Status: DC

## 2013-02-23 NOTE — Assessment & Plan Note (Signed)
The current medical regimen is effective;  continue present plan and medications. Lab Results  Component Value Date   TSH 0.19* 01/23/2012

## 2013-02-23 NOTE — Assessment & Plan Note (Signed)
Uses Colace 4-6/day and Miralax without daily BM Check Mg, Vit D and lytes Try amitiza

## 2013-02-23 NOTE — Patient Instructions (Signed)
It was good to see you today. We have reviewed your prior records including labs and tests today Health Maintenance reviewed - all recommended immunizations and age-appropriate screenings are up-to-date. we'll make referral to gynecology. Our office will contact you regarding appointment(s) once made. Test(s) ordered today. Your results will be released to MyChart (or called to you) after review, usually within 72hours after test completion. If any changes need to be made, you will be notified at that same time. Try Amitiza 2x/day - or Linzess once daily - for constipation symptoms - If different prescription is needed, please let me know same Other Medications reviewed and updated, no other changes at this time.  Please schedule followup in 1 years for medical physical and labs, call sooner if problems. Health Maintenance, Females A healthy lifestyle and preventative care can promote health and wellness.  Maintain regular health, dental, and eye exams.  Eat a healthy diet. Foods like vegetables, fruits, whole grains, low-fat dairy products, and lean protein foods contain the nutrients you need without too many calories. Decrease your intake of foods high in solid fats, added sugars, and salt. Get information about a proper diet from your caregiver, if necessary.  Regular physical exercise is one of the most important things you can do for your health. Most adults should get at least 150 minutes of moderate-intensity exercise (any activity that increases your heart rate and causes you to sweat) each week. In addition, most adults need muscle-strengthening exercises on 2 or more days a week.   Maintain a healthy weight. The body mass index (BMI) is a screening tool to identify possible weight problems. It provides an estimate of body fat based on height and weight. Your caregiver can help determine your BMI, and can help you achieve or maintain a healthy weight. For adults 20 years and older:  A  BMI below 18.5 is considered underweight.  A BMI of 18.5 to 24.9 is normal.  A BMI of 25 to 29.9 is considered overweight.  A BMI of 30 and above is considered obese.  Maintain normal blood lipids and cholesterol by exercising and minimizing your intake of saturated fat. Eat a balanced diet with plenty of fruits and vegetables. Blood tests for lipids and cholesterol should begin at age 50 and be repeated every 5 years. If your lipid or cholesterol levels are high, you are over 50, or you are a high risk for heart disease, you may need your cholesterol levels checked more frequently.Ongoing high lipid and cholesterol levels should be treated with medicines if diet and exercise are not effective.  If you smoke, find out from your caregiver how to quit. If you do not use tobacco, do not start.  If you are pregnant, do not drink alcohol. If you are breastfeeding, be very cautious about drinking alcohol. If you are not pregnant and choose to drink alcohol, do not exceed 1 drink per day. One drink is considered to be 12 ounces (355 mL) of beer, 5 ounces (148 mL) of wine, or 1.5 ounces (44 mL) of liquor.  Avoid use of street drugs. Do not share needles with anyone. Ask for help if you need support or instructions about stopping the use of drugs.  High blood pressure causes heart disease and increases the risk of stroke. Blood pressure should be checked at least every 1 to 2 years. Ongoing high blood pressure should be treated with medicines, if weight loss and exercise are not effective.  If you are 55 to  37 years old, ask your caregiver if you should take aspirin to prevent strokes.  Diabetes screening involves taking a blood sample to check your fasting blood sugar level. This should be done once every 3 years, after age 4, if you are within normal weight and without risk factors for diabetes. Testing should be considered at a younger age or be carried out more frequently if you are overweight and  have at least 1 risk factor for diabetes.  Breast cancer screening is essential preventative care for women. You should practice "breast self-awareness." This means understanding the normal appearance and feel of your breasts and may include breast self-examination. Any changes detected, no matter how small, should be reported to a caregiver. Women in their 42s and 30s should have a clinical breast exam (CBE) by a caregiver as part of a regular health exam every 1 to 3 years. After age 68, women should have a CBE every year. Starting at age 8, women should consider having a mammogram (breast X-ray) every year. Women who have a family history of breast cancer should talk to their caregiver about genetic screening. Women at a high risk of breast cancer should talk to their caregiver about having an MRI and a mammogram every year.  The Pap test is a screening test for cervical cancer. Women should have a Pap test starting at age 49. Between ages 46 and 82, Pap tests should be repeated every 2 years. Beginning at age 50, you should have a Pap test every 3 years as long as the past 3 Pap tests have been normal. If you had a hysterectomy for a problem that was not cancer or a condition that could lead to cancer, then you no longer need Pap tests. If you are between ages 79 and 61, and you have had normal Pap tests going back 10 years, you no longer need Pap tests. If you have had past treatment for cervical cancer or a condition that could lead to cancer, you need Pap tests and screening for cancer for at least 20 years after your treatment. If Pap tests have been discontinued, risk factors (such as a new sexual partner) need to be reassessed to determine if screening should be resumed. Some women have medical problems that increase the chance of getting cervical cancer. In these cases, your caregiver may recommend more frequent screening and Pap tests.  The human papillomavirus (HPV) test is an additional test  that may be used for cervical cancer screening. The HPV test looks for the virus that can cause the cell changes on the cervix. The cells collected during the Pap test can be tested for HPV. The HPV test could be used to screen women aged 54 years and older, and should be used in women of any age who have unclear Pap test results. After the age of 16, women should have HPV testing at the same frequency as a Pap test.  Colorectal cancer can be detected and often prevented. Most routine colorectal cancer screening begins at the age of 47 and continues through age 87. However, your caregiver may recommend screening at an earlier age if you have risk factors for colon cancer. On a yearly basis, your caregiver may provide home test kits to check for hidden blood in the stool. Use of a small camera at the end of a tube, to directly examine the colon (sigmoidoscopy or colonoscopy), can detect the earliest forms of colorectal cancer. Talk to your caregiver about this at age  50, when routine screening begins. Direct examination of the colon should be repeated every 5 to 10 years through age 43, unless early forms of pre-cancerous polyps or small growths are found.  Hepatitis C blood testing is recommended for all people born from [birth year] through 1965 and any individual with known risks for hepatitis C.  Practice safe sex. Use condoms and avoid high-risk sexual practices to reduce the spread of sexually transmitted infections (STIs). Sexually active women aged 49 and younger should be checked for Chlamydia, which is a common sexually transmitted infection. Older women with new or multiple partners should also be tested for Chlamydia. Testing for other STIs is recommended if you are sexually active and at increased risk.  Osteoporosis is a disease in which the bones lose minerals and strength with aging. This can result in serious bone fractures. The risk of osteoporosis can be identified using a bone density scan. Women  ages 66 and over and women at risk for fractures or osteoporosis should discuss screening with their caregivers. Ask your caregiver whether you should be taking a calcium supplement or vitamin D to reduce the rate of osteoporosis.  Menopause can be associated with physical symptoms and risks. Hormone replacement therapy is available to decrease symptoms and risks. You should talk to your caregiver about whether hormone replacement therapy is right for you.  Use sunscreen with a sun protection factor (SPF) of 30 or greater. Apply sunscreen liberally and repeatedly throughout the day. You should seek shade when your shadow is shorter than you. Protect yourself by wearing long sleeves, pants, a wide-brimmed hat, and sunglasses year round, whenever you are outdoors.  Notify your caregiver of new moles or changes in moles, especially if there is a change in shape or color. Also notify your caregiver if a mole is larger than the size of a pencil eraser.  Stay current with your immunizations. Document Released: 06/18/2011 Document Revised: 02/25/2012 Document Reviewed: 06/18/2011 [facility] Patient Information 2013 [place], [place].

## 2013-02-23 NOTE — Progress Notes (Signed)
Subjective:    Patient ID: Cynthia Ramsey, female    DOB: 1976/03/31, 37 y.o.   MRN: [id number]  HPI  patient is here today for annual physical. Patient feels well and has no complaints.  Also reviewed chronic medical issues:   hypothyroid -  onset in 2009 with pregnancy -  followed with endo for same in [place] in 2010 -  symptoms have been stable on current med dose - compliant as rx'd  no diarrhea, but chronic constipation, no weight changes or fatigue  denies hair or skin change    Past Medical History  Diagnosis Date  . ALLERGIC RHINITIS   . Headache   . HYPOTHYROIDISM     Hashimoto's history   Family History  Problem Relation Age of Onset  . Hyperlipidemia Mother   . Hypertension Mother   . Heart disease Father   . Atrial fibrillation Father    History  Substance Use Topics  . Smoking status: Never Smoker   . Smokeless tobacco: Not on file     Comment: Married, lives with spouse. 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 Alcohol Use: No    Review of Systems  Constitutional: Negative for fever and unexpected weight change.  Respiratory: Negative for chest tightness and shortness of breath.   Cardiovascular: Negative for palpitations and leg swelling.  Gastrointestinal: Negative for abdominal pain, no bowel changes.  Musculoskeletal: Negative for gait problem or joint swelling.  Skin: Negative for rash.  Neurological: Negative for dizziness or headache.  No other specific complaints in a complete review of systems (except as listed in HPI above).      Objective:   Physical Exam  BP 98/62  Pulse 66  Temp(Src) 97.4 F (36.3 C) (Oral)  Ht 5\' 2"  (1.575 m)  Wt 112 lb (50.803 kg)  BMI 20.48 kg/m2  SpO2 98% Wt Readings from Last 3 Encounters:  02/23/13 112 lb (50.803 kg)  01/23/12 111 lb (50.349 kg)  11/15/11 108 lb 6.4 oz (49.17 kg)   Constitutional: She appears well-developed and well-nourished. No distress.  HENT: Head: Normocephalic and atraumatic. Ears: B  TMs ok, no erythema or effusion; Nose: Nose normal. Mouth/Throat: Oropharynx is clear and moist. No oropharyngeal exudate.  Eyes: Conjunctivae and EOM are normal. Pupils are equal, round, and reactive to light. No scleral icterus.  Neck: Normal range of motion. Neck supple. No JVD present. No thyromegaly present.  Cardiovascular: Normal rate, regular rhythm and normal heart sounds.  No murmur heard. No BLE edema. Pulmonary/Chest: Effort normal and breath sounds normal. No respiratory distress. She has no wheezes.  Abdominal: Soft. Bowel sounds are normal. She exhibits no distension. There is no tenderness. no masses Musculoskeletal: Normal range of motion, no joint effusions. No gross deformities Neurological: She is alert and oriented to person, place, and time. No cranial nerve deficit. Coordination normal.  Skin: Skin is warm and dry. No rash noted. No erythema.  Psychiatric: She has a normal mood and affect. Her behavior is normal. Judgment and thought content normal.    Lab Results  Component Value Date   WBC 5.4 01/23/2012   HGB 13.1 01/23/2012   HCT 38.5 01/23/2012   PLT 264.0 01/23/2012   GLUCOSE 81 12/12/2010   CHOL 178 12/12/2010   TRIG 57.0 12/12/2010   HDL 54.60 12/12/2010   LDLCALC 112* 12/12/2010   ALT 23 12/12/2010   AST 22 12/12/2010   NA 138 12/12/2010   K 4.4 12/12/2010   CL 103 12/12/2010   CREATININE  0.7 12/12/2010   BUN 16 12/12/2010   CO2 28 12/12/2010   TSH 0.19* 01/23/2012      Assessment & Plan:   CPX/v70.0 - Patient has been counseled on age-appropriate routine health concerns for screening and prevention. These are reviewed and up-to-date. Immunizations are up-to-date or declined. Labs ordered and reviewed. Refer to gyn for IUD exchange  Also See problem list. Medications and labs reviewed today.

## 2013-02-24 ENCOUNTER — Encounter: Payer: Self-pay | Admitting: Internal Medicine

## 2013-03-17 LAB — HM PAP SMEAR

## 2013-03-25 ENCOUNTER — Encounter: Payer: Self-pay | Admitting: Internal Medicine

## 2013-03-25 DIAGNOSIS — E559 Vitamin D deficiency, unspecified: Secondary | ICD-10-CM

## 2013-03-26 ENCOUNTER — Encounter: Payer: Self-pay | Admitting: Internal Medicine

## 2013-03-26 MED ORDER — LINACLOTIDE 290 MCG PO CAPS: 290.0000 ug | ORAL_CAPSULE | Freq: Every day | ORAL | Status: DC

## 2013-10-22 ENCOUNTER — Other Ambulatory Visit: Payer: Self-pay

## 2014-02-27 ENCOUNTER — Other Ambulatory Visit: Payer: Self-pay | Admitting: Internal Medicine

## 2014-03-02 ENCOUNTER — Other Ambulatory Visit: Payer: Self-pay | Admitting: Internal Medicine

## 2014-03-11 LAB — BASIC METABOLIC PANEL
BUN: 12 mg/dL (ref 4–21)
Creatinine: 0.8 mg/dL (ref 0.5–1.1)
Glucose: 106 mg/dL
POTASSIUM: 3.9 mmol/L (ref 3.4–5.3)
Sodium: 136 mmol/L — AB (ref 137–147)

## 2014-03-11 LAB — HEPATIC FUNCTION PANEL
ALT: 17 U/L (ref 7–35)
AST: 20 U/L (ref 13–35)
Alkaline Phosphatase: 33 U/L (ref 25–125)
Bilirubin, Total: 0.3 mg/dL

## 2014-03-11 LAB — LIPID PANEL
CHOLESTEROL: 152 mg/dL (ref 0–200)
HDL: 51 mg/dL (ref 35–70)
LDL Cholesterol: 83 mg/dL
TRIGLYCERIDES: 94 mg/dL (ref 40–160)

## 2014-03-11 LAB — CBC AND DIFFERENTIAL
HEMATOCRIT: 37 % (ref 36–46)
HEMOGLOBIN: 12.2 g/dL (ref 12.0–16.0)
PLATELETS: 263 10*3/uL (ref 150–399)
WBC: 5.3 10*3/mL

## 2014-03-11 LAB — TSH: TSH: 0.05 u[IU]/mL — AB (ref 0.41–5.90)

## 2014-03-23 ENCOUNTER — Ambulatory Visit (INDEPENDENT_AMBULATORY_CARE_PROVIDER_SITE_OTHER): Payer: Managed Care, Other (non HMO) | Admitting: Internal Medicine

## 2014-03-23 ENCOUNTER — Other Ambulatory Visit (INDEPENDENT_AMBULATORY_CARE_PROVIDER_SITE_OTHER): Payer: Managed Care, Other (non HMO)

## 2014-03-23 ENCOUNTER — Encounter: Payer: Self-pay | Admitting: Internal Medicine

## 2014-03-23 VITALS — BP 110/62 | HR 57 | Temp 97.6°F | Ht 62.0 in | Wt 116.1 lb

## 2014-03-23 DIAGNOSIS — R739 Hyperglycemia, unspecified: Secondary | ICD-10-CM

## 2014-03-23 DIAGNOSIS — R7309 Other abnormal glucose: Secondary | ICD-10-CM

## 2014-03-23 DIAGNOSIS — Z0000 Encounter for general adult medical examination without abnormal findings: Secondary | ICD-10-CM

## 2014-03-23 LAB — HEMOGLOBIN A1C: Hgb A1c MFr Bld: 5.4 % (ref 4.6–6.5)

## 2014-03-23 MED ORDER — LINACLOTIDE 290 MCG PO CAPS: 290.0000 ug | ORAL_CAPSULE | Freq: Every day | ORAL | Status: DC

## 2014-03-23 MED ORDER — LEVOTHYROXINE SODIUM 100 MCG PO TABS: ORAL_TABLET | ORAL | Status: DC

## 2014-03-23 MED ORDER — LEVOTHYROXINE SODIUM 88 MCG PO TABS: ORAL_TABLET | ORAL | Status: DC

## 2014-03-23 NOTE — Progress Notes (Signed)
Pre visit review using our clinic review tool, if applicable. No additional management support is needed unless otherwise documented below in the visit note. 

## 2014-03-23 NOTE — Progress Notes (Signed)
Subjective:    Patient ID: Cynthia Ramsey, female    DOB: 1976/09/27, 38 y.o.   MRN: [id number]  HPI  patient is here today for annual physical. Patient feels well and has no complaints.  noted mild random hyperglycemia at recent doctor day labs last month  Past Medical History  Diagnosis Date  . ALLERGIC RHINITIS   . Headache(784.0)   . HYPOTHYROIDISM     Hashimoto's history   Family History  Problem Relation Age of Onset  . Hyperlipidemia Mother   . Hypertension Mother   . Heart disease Father   . Atrial fibrillation Father    History  Substance Use Topics  . Smoking status: Never Smoker   . 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 Alcohol Use: No    Review of Systems  Constitutional: Negative for fatigue and unexpected weight change.  Respiratory: Negative for cough, shortness of breath and wheezing.   Cardiovascular: Negative for chest pain, palpitations and leg swelling.  Gastrointestinal: Negative for nausea, abdominal pain and diarrhea.  Neurological: Negative for dizziness, weakness, light-headedness and headaches.  Psychiatric/Behavioral: Negative for dysphoric mood. The patient is not nervous/anxious.   All other systems reviewed and are negative.       Objective:   Physical Exam  BP 110/62  Pulse 57  Temp(Src) 97.6 F (36.4 C) (Oral)  Ht 5\' 2"  (1.575 m)  Wt 116 lb 1.9 oz (52.672 kg)  BMI 21.23 kg/m2  SpO2 99% Wt Readings from Last 3 Encounters:  03/23/14 116 lb 1.9 oz (52.672 kg)  02/23/13 112 lb (50.803 kg)  01/23/12 111 lb (50.349 kg)   Constitutional: She appears well-developed and well-nourished. No distress.  HENT: Head: Normocephalic and atraumatic. Ears: B TMs ok, no erythema or effusion; Nose: Nose normal. Mouth/Throat: Oropharynx is clear and moist. No oropharyngeal exudate.  Eyes: Conjunctivae and EOM are normal. Pupils are equal, round, and reactive to light. No  scleral icterus.  Neck: Normal range of motion. Neck supple. No JVD present. No thyromegaly present.  Cardiovascular: Normal rate, regular rhythm and normal heart sounds.  No murmur heard. No BLE edema. Pulmonary/Chest: Effort normal and breath sounds normal. No respiratory distress. She has no wheezes.  Abdominal: Soft. Bowel sounds are normal. She exhibits no distension. There is no tenderness. no masses Musculoskeletal: Normal range of motion, no joint effusions. No gross deformities Neurological: She is alert and oriented to person, place, and time. No cranial nerve deficit. Coordination, balance, strength, speech and gait are normal.  Skin: Skin is warm and dry. No rash noted. No erythema.  Psychiatric: She has a normal mood and affect. Her behavior is normal. Judgment and thought content normal.   Lab Results  Component Value Date   WBC 5.3 03/11/2014   HGB 12.2 03/11/2014   HCT 37 03/11/2014   PLT 263 03/11/2014   GLUCOSE 96 02/23/2013   CHOL 152 03/11/2014   TRIG 94 03/11/2014   HDL 51 03/11/2014   LDLCALC 83 03/11/2014   ALT 17 03/11/2014   AST 20 03/11/2014   NA 136* 03/11/2014   K 3.9 03/11/2014   CL 107 02/23/2013   CREATININE 0.8 03/11/2014   BUN 12 03/11/2014   CO2 24 02/23/2013   TSH 0.05* 03/11/2014    Ct Angio Chest W/cm &/or Wo Cm  01/23/2012   **ADDENDUM** CREATED: 01/23/2012 13:43:55  There is a small 4 mm right upper lobe pulmonary  nodule.  If the patient is at high risk for bronchogenic carcinoma, follow-up chest CT at 1 year is recommended.  If the patient is at low risk, no follow-up is needed.  This recommendation follows the consensus statement: Guidelines for Management of Small Pulmonary Nodules Detected on CT Scans:  A Statement from the Globe as published in Radiology 2005; 237:395-400.  Available online at: https://www.arnold.com/.  **END ADDENDUM** SIGNED BY: Cathlean Sauer. Hoss, M.D.   01/23/2012   *RADIOLOGY REPORT*  Clinical Data:  Chest pain  CT ANGIOGRAPHY CHEST  Technique:  Multidetector CT imaging of the chest using the standard protocol during bolus administration of intravenous contrast. Multiplanar reconstructed images including MIPs were obtained and reviewed to evaluate the vascular anatomy.  Contrast: 18mL OMNIPAQUE IOHEXOL 300 MG/ML IV SOLN  Comparison: None.  Findings: There are no filling defects in the pulmonary arterial tree to suggest acute pulmonary thromboembolism.  Negative abnormal mediastinal adenopathy.  Unremarkable appearance of the aorta.  Negative pericardial effusion.  No pneumothorax.  No pleural effusion.  No acute bony deformity.  IMPRESSION: Negative study.  Original Report Authenticated By: Jamas Lav, M.D.      Assessment & Plan:   CPX/v70.0 - Patient has been counseled on age-appropriate routine health concerns for screening and prevention. These are reviewed and up-to-date. Immunizations are up-to-date or declined. Labs reviewed.  Random mild hyperglycemia. No family history or personal history of diabetes. Check A1c. Further advice dependent result

## 2014-03-23 NOTE — Patient Instructions (Signed)
It was good to see you today.  We have reviewed your prior records including labs and tests today  Health Maintenance reviewed - all recommended immunizations and age-appropriate screenings are up-to-date.  Test(s) ordered today. Your results will be released to MyChart (or called to you) after review, usually within 72hours after test completion. If any changes need to be made, you will be notified at that same time.  Medications reviewed and updated, no changes recommended at this time.  Please schedule followup in 12 months for annual exam and labs, call sooner if problems.  

## 2014-04-30 ENCOUNTER — Ambulatory Visit (INDEPENDENT_AMBULATORY_CARE_PROVIDER_SITE_OTHER): Payer: Managed Care, Other (non HMO) | Admitting: Internal Medicine

## 2014-04-30 ENCOUNTER — Encounter: Payer: Self-pay | Admitting: Internal Medicine

## 2014-04-30 VITALS — BP 90/58 | HR 65 | Temp 98.1°F

## 2014-04-30 DIAGNOSIS — R51 Headache: Secondary | ICD-10-CM

## 2014-04-30 MED ORDER — TRAMADOL HCL 50 MG PO TABS
50.0000 mg | ORAL_TABLET | Freq: Four times a day (QID) | ORAL | Status: DC | PRN
Start: 2014-04-30 — End: 2015-06-22

## 2014-04-30 NOTE — Progress Notes (Signed)
Subjective:    Patient ID: Cynthia Ramsey, female    DOB: May 08, 1976, 38 y.o.   MRN: [id number]  HPI She's had headaches since college but she's had exacerbation over the last month. They're now described as almost daily, constant and dull.  She can awaken with headaches. Stress exacerbates it.  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 She exercises regularly.  The headache is mainly in temporal areas.  She was taking Excedrin extra strength up to 2 daily . The past week  she has taken up to 6 a day. She only drinks one cup of coffee a day. No NSAID intake.  Headaches not associated with any prodrome or but are associated with some sensitivity to light and sound.   Review of Systems  She specifically denies blurred vision, double vision, loss of vision  She has no excessive tearing arise  There is no tinnitus or hearing loss  Vertigo is denied  She has no weakness, numbness, tingling  She has no gait imbalance  There is no history to suggest sinusitis such as frontal sinus pain, facial pain, nasal purulence.  She has no fever, chills, or sweats   There's been no rash or change in color/temperature of skin in area of pain.  She has no abnormal bruising or bleeding.       Objective:   Physical Exam Gen.: Thin but healthy and well-nourished in appearance. Alert, appropriate and cooperative throughout exam. Appears younger than stated age  Head: Normocephalic without obvious abnormalities Eyes: No corneal or conjunctival inflammation noted. Pupils equal round reactive to light and accommodation. Extraocular motion intact. Field of Vision grossly normal. Vision normal without lenses Ears: External  ear exam reveals no significant lesions or deformities. Canals clear .TMs normal. Hearing is grossly normal bilaterally. Nose: External nasal exam reveals no deformity or  inflammation. Nasal mucosa are pink and moist. No lesions or exudates noted.   Mouth: Oral mucosa and oropharynx reveal no lesions or exudates. Teeth in good repair. Neck: No deformities, masses, or tenderness noted. Range of motion & Thyroid normal Lungs: Normal respiratory effort; chest expands symmetrically. Lungs are clear to auscultation without rales, wheezes, or increased work of breathing. Heart: Normal rhythm; slow rate. Normal S1 and S2. No gallop, click, or rub. No murmur.                               Musculoskeletal/extremities: No deformity or scoliosis noted of  the thoracic or lumbar spine.  No clubbing, cyanosis, edema, or significant extremity  deformity noted. Range of motion normal .Tone & strength normal. Hand joints normal  Fingernail health good. Able to lie down & sit up w/o help. Negative SLR bilaterally Vascular: Carotid, radial artery, dorsalis pedis and  posterior tibial pulses are full and equal. No bruits present. Neurologic: Alert and oriented x3. Deep tendon reflexes symmetrical and normal. No cranial nerve deficit. Gait normal  including heel & toe walking . Rhomberg & finger to nose normal.       Skin: Intact without suspicious lesions or rashes. Lymph: No cervical, axillary lymphadenopathy present. Psych: Mood and affect are normal. Normally interactive  Assessment & Plan:   #1 chronic daily headache with no neurologic deficit. This probably or represents conversion of migraine variant to chronic daily headache , possibly related to excess Excedrin use. #2 situational (job & childcare ) stress; medication declined.The pathophysiology of neurotransmitter deficiency in relationship to headaches was discussed . Other therapeutic options discussed. See after visit summary and orders

## 2014-04-30 NOTE — Patient Instructions (Signed)
Chronic daily headaches represent  a conversion of migraines into a headache which recurs repeatedly every day .These are almost always  due to the use of excess nonsteroidals such as ibuprofen or naproxen and or caffeine ( as in Excedrin). The nonsteroidals and caffeine should be weaned as quickly as possible; but they should not be "cold [place]" going from a high dose to none. Please keep a diary of your headaches . Document  each occurrence on the calendar with notation of : #1 any prodrome ( any non headache symptom such as marked fatigue,visual changes, ,etc ) which precedes actual headache ; #2) severity on 1-10 scale; #3) any triggers ( food/ drink,enviromenntal or weather changes ,physical or emotional stress) in 8-12 hour period prior to the headache; & #4) response to any medications or other intervention. Please review "Headache" @ [facility] for additional information.

## 2014-04-30 NOTE — Progress Notes (Signed)
Pre visit review using our clinic review tool, if applicable. No additional management support is needed unless otherwise documented below in the visit note. 

## 2015-03-28 ENCOUNTER — Telehealth: Payer: Self-pay | Admitting: Internal Medicine

## 2015-03-28 DIAGNOSIS — Z0000 Encounter for general adult medical examination without abnormal findings: Secondary | ICD-10-CM

## 2015-03-28 MED ORDER — LEVOTHYROXINE SODIUM 100 MCG PO TABS: ORAL_TABLET | ORAL | Status: DC

## 2015-03-28 MED ORDER — LEVOTHYROXINE SODIUM 88 MCG PO TABS: ORAL_TABLET | ORAL | Status: DC

## 2015-03-28 NOTE — Telephone Encounter (Signed)
Notified pt will send refill also went ahead and order cpx labs...Cynthia Ramsey

## 2015-03-28 NOTE — Telephone Encounter (Signed)
Pt was wondering if Dr. Asa Lente can refill her levothyroxine (SYNTHROID, [place]) 100 MCG  Until she come in for the appt on 06/22/15. Pt also stated she need lab check before the appt on 06/22/15 and she was wondering if Dr. Asa Lente can put in an order. Pt does not want to change PCP,really like to stay with [facility]. Please help

## 2015-03-29 ENCOUNTER — Other Ambulatory Visit: Payer: Self-pay | Admitting: Internal Medicine

## 2015-03-29 ENCOUNTER — Other Ambulatory Visit (INDEPENDENT_AMBULATORY_CARE_PROVIDER_SITE_OTHER): Payer: Managed Care, Other (non HMO)

## 2015-03-29 DIAGNOSIS — Z0189 Encounter for other specified special examinations: Secondary | ICD-10-CM | POA: Diagnosis not present

## 2015-03-29 DIAGNOSIS — Z0000 Encounter for general adult medical examination without abnormal findings: Secondary | ICD-10-CM

## 2015-03-29 LAB — CBC WITH DIFFERENTIAL/PLATELET
BASOS ABS: 0 10*3/uL (ref 0.0–0.1)
Basophils Relative: 0.5 % (ref 0.0–3.0)
EOS PCT: 3.6 % (ref 0.0–5.0)
Eosinophils Absolute: 0.2 10*3/uL (ref 0.0–0.7)
HCT: 38.7 % (ref 36.0–46.0)
Hemoglobin: 13.1 g/dL (ref 12.0–15.0)
LYMPHS ABS: 2.1 10*3/uL (ref 0.7–4.0)
Lymphocytes Relative: 33 % (ref 12.0–46.0)
MCHC: 33.9 g/dL (ref 30.0–36.0)
MCV: 86.3 fl (ref 78.0–100.0)
MONO ABS: 0.4 10*3/uL (ref 0.1–1.0)
MONOS PCT: 6.1 % (ref 3.0–12.0)
NEUTROS PCT: 56.8 % (ref 43.0–77.0)
Neutro Abs: 3.6 10*3/uL (ref 1.4–7.7)
PLATELETS: 289 10*3/uL (ref 150.0–400.0)
RBC: 4.48 Mil/uL (ref 3.87–5.11)
RDW: 13.2 % (ref 11.5–15.5)
WBC: 6.3 10*3/uL (ref 4.0–10.5)

## 2015-03-29 LAB — HEPATIC FUNCTION PANEL
ALT: 47 U/L — ABNORMAL HIGH (ref 0–35)
AST: 35 U/L (ref 0–37)
Albumin: 4 g/dL (ref 3.5–5.2)
Alkaline Phosphatase: 47 U/L (ref 39–117)
Bilirubin, Direct: 0.1 mg/dL (ref 0.0–0.3)
TOTAL PROTEIN: 6.8 g/dL (ref 6.0–8.3)
Total Bilirubin: 0.4 mg/dL (ref 0.2–1.2)

## 2015-03-29 LAB — URINALYSIS, ROUTINE W REFLEX MICROSCOPIC
HGB URINE DIPSTICK: NEGATIVE
Nitrite: NEGATIVE
RBC / HPF: NONE SEEN (ref 0–?)
Specific Gravity, Urine: >=1.03 — AB (ref 1.000–1.030)
TOTAL PROTEIN, URINE-UPE24: NEGATIVE
URINE GLUCOSE: NEGATIVE
UROBILINOGEN UA: 1 (ref 0.0–1.0)
pH: 5.5 (ref 5.0–8.0)

## 2015-03-29 LAB — LIPID PANEL
CHOL/HDL RATIO: 3
Cholesterol: 163 mg/dL (ref 0–200)
HDL: 47.9 mg/dL (ref >39.00)
LDL CALC: 100 mg/dL — AB (ref 0–99)
NonHDL: 115.1
Triglycerides: 74 mg/dL (ref 0.0–149.0)
VLDL: 14.8 mg/dL (ref 0.0–40.0)

## 2015-03-29 LAB — BASIC METABOLIC PANEL
BUN: 15 mg/dL (ref 6–23)
CALCIUM: 9.1 mg/dL (ref 8.4–10.5)
CO2: 28 mEq/L (ref 19–32)
Chloride: 105 mEq/L (ref 96–112)
Creatinine, Ser: 0.84 mg/dL (ref 0.40–1.20)
GFR: 80.2 mL/min (ref >60.00)
Glucose, Bld: 103 mg/dL — ABNORMAL HIGH (ref 70–99)
POTASSIUM: 4.5 meq/L (ref 3.5–5.1)
SODIUM: 137 meq/L (ref 135–145)

## 2015-03-29 LAB — TSH: TSH: 0.25 u[IU]/mL — AB (ref 0.35–4.50)

## 2015-06-22 ENCOUNTER — Encounter: Payer: Self-pay | Admitting: Internal Medicine

## 2015-06-22 ENCOUNTER — Other Ambulatory Visit (INDEPENDENT_AMBULATORY_CARE_PROVIDER_SITE_OTHER): Payer: Managed Care, Other (non HMO)

## 2015-06-22 ENCOUNTER — Ambulatory Visit (INDEPENDENT_AMBULATORY_CARE_PROVIDER_SITE_OTHER): Payer: Managed Care, Other (non HMO) | Admitting: Internal Medicine

## 2015-06-22 ENCOUNTER — Ambulatory Visit: Payer: Managed Care, Other (non HMO) | Admitting: Internal Medicine

## 2015-06-22 VITALS — BP 96/60 | HR 66 | Temp 98.1°F | Resp 12 | Ht 62.0 in | Wt 115.0 lb

## 2015-06-22 DIAGNOSIS — E038 Other specified hypothyroidism: Secondary | ICD-10-CM | POA: Diagnosis not present

## 2015-06-22 DIAGNOSIS — R7989 Other specified abnormal findings of blood chemistry: Secondary | ICD-10-CM | POA: Insufficient documentation

## 2015-06-22 DIAGNOSIS — R945 Abnormal results of liver function studies: Secondary | ICD-10-CM

## 2015-06-22 LAB — COMPREHENSIVE METABOLIC PANEL
ALK PHOS: 42 U/L (ref 39–117)
ALT: 14 U/L (ref 0–35)
AST: 15 U/L (ref 0–37)
Albumin: 3.9 g/dL (ref 3.5–5.2)
BUN: 16 mg/dL (ref 6–23)
CO2: 30 mEq/L (ref 19–32)
CREATININE: 0.82 mg/dL (ref 0.40–1.20)
Calcium: 9.4 mg/dL (ref 8.4–10.5)
Chloride: 106 mEq/L (ref 96–112)
GFR: 82.36 mL/min (ref >60.00)
Glucose, Bld: 90 mg/dL (ref 70–99)
POTASSIUM: 4.3 meq/L (ref 3.5–5.1)
Sodium: 141 mEq/L (ref 135–145)
Total Bilirubin: 0.4 mg/dL (ref 0.2–1.2)
Total Protein: 6.8 g/dL (ref 6.0–8.3)

## 2015-06-22 LAB — HEPATITIS B CORE ANTIBODY, TOTAL: Hep B Core Total Ab: NONREACTIVE

## 2015-06-22 LAB — HEPATITIS C ANTIBODY: HCV AB: NEGATIVE

## 2015-06-22 LAB — HEPATITIS B SURFACE ANTIBODY,QUALITATIVE: HEP B S AB: POSITIVE — AB

## 2015-06-22 LAB — HEMOGLOBIN A1C: Hgb A1c MFr Bld: 5.2 % (ref 4.6–6.5)

## 2015-06-22 LAB — HEPATITIS A ANTIBODY, TOTAL: Hep A Total Ab: REACTIVE — AB

## 2015-06-22 LAB — TSH: TSH: 0.09 u[IU]/mL — ABNORMAL LOW (ref 0.35–4.50)

## 2015-06-22 MED ORDER — LEVOTHYROXINE SODIUM 75 MCG PO TABS: 75.0000 ug | ORAL_TABLET | Freq: Every day | ORAL | Status: DC

## 2015-06-22 NOTE — Patient Instructions (Signed)
Hypothyroidism The thyroid is a large gland located in the lower front of your neck. The thyroid gland helps control metabolism. Metabolism is how your body handles food. It controls metabolism with the hormone thyroxine. When this gland is underactive (hypothyroid), it produces too little hormone.  CAUSES These include:   Absence or destruction of thyroid tissue.  Goiter due to iodine deficiency.  Goiter due to medications.  Congenital defects (since birth).  Problems with the pituitary. This causes a lack of TSH (thyroid stimulating hormone). This hormone tells the thyroid to turn out more hormone. SYMPTOMS  Lethargy (feeling as though you have no energy)  Cold intolerance  Weight gain (in spite of normal food intake)  Dry skin  Coarse hair  Menstrual irregularity (if severe, may lead to infertility)  Slowing of thought processes Cardiac problems are also caused by insufficient amounts of thyroid hormone. Hypothyroidism in the newborn is cretinism, and is an extreme form. It is important that this form be treated adequately and immediately or it will lead rapidly to retarded physical and mental development. DIAGNOSIS  To prove hypothyroidism, your caregiver may do blood tests and ultrasound tests. Sometimes the signs are hidden. It may be necessary for your caregiver to watch this illness with blood tests either before or after diagnosis and treatment. TREATMENT  Low levels of thyroid hormone are increased by using synthetic thyroid hormone. This is a safe, effective treatment. It usually takes about four weeks to gain the full effects of the medication. After you have the full effect of the medication, it will generally take another four weeks for problems to leave. Your caregiver may start you on low doses. If you have had heart problems the dose may be gradually increased. It is generally not an emergency to get rapidly to normal. HOME CARE INSTRUCTIONS   Take your  medications as your caregiver suggests. Let your caregiver know of any medications you are taking or start taking. Your caregiver will help you with dosage schedules.  As your condition improves, your dosage needs may increase. It will be necessary to have continuing blood tests as suggested by your caregiver.  Report all suspected medication side effects to your caregiver. SEEK MEDICAL CARE IF: Seek medical care if you develop:  Sweating.  Tremulousness (tremors).  Anxiety.  Rapid weight loss.  Heat intolerance.  Emotional swings.  Diarrhea.  Weakness. SEEK IMMEDIATE MEDICAL CARE IF:  You develop chest pain, an irregular heart beat (palpitations), or a rapid heart beat. MAKE SURE YOU:   Understand these instructions.  Will watch your condition.  Will get help right away if you are not doing well or get worse. Document Released: 12/03/2005 Document Revised: 02/25/2012 Document Reviewed: 07/23/2008 ExitCare Patient Information 2015 [employer]. This information is not intended to replace advice given to you by your health care provider. Make sure you discuss any questions you have with your health care provider.  

## 2015-06-22 NOTE — Progress Notes (Signed)
Subjective:  Patient ID: Cynthia Ramsey, female    DOB: June 21, 1976  Age: 39 y.o. MRN: [id number]  CC: Hypothyroidism new to me  HPI Cynthia Ramsey presents for follow up - she had labs done about 3 months ago and had one very slightly elevated LFT and she admits that she was taking motrin at the time. She has not had any s/s related to liver disease. She also needs a follow up on hypothyroidism today.  History Cynthia Ramsey has a past medical history of ALLERGIC RHINITIS; Headache(784.0); and HYPOTHYROIDISM.   She has past surgical history that includes Child birth (6 & 09).   Her family history includes Atrial fibrillation in her father; Heart disease in her father; Hyperlipidemia in her mother; Hypertension in her mother.She reports that she has never smoked. She does not have any smokeless tobacco history on file. She reports that she does not drink alcohol or use illicit drugs.  Outpatient Prescriptions Prior to Visit  Medication Sig Dispense Refill  . Cholecalciferol (VITAMIN D) 2000 UNITS tablet Take 1 tablet (2,000 Units total) by mouth daily. 30 tablet 11  . Multiple Vitamin (MULTIVITAMIN) tablet Take 1 tablet by mouth daily.     Marland Kitchen levothyroxine (SYNTHROID, LEVOTHROID) 100 MCG tablet 134mcg daily except Wed and Sun 90 tablet 0  . levothyroxine (SYNTHROID, LEVOTHROID) 88 MCG tablet 43mcg on Wed and Sun 90 tablet 0  . Linaclotide (LINZESS) 290 MCG CAPS capsule Take 1 capsule (290 mcg total) by mouth daily. (Patient not taking: Reported on 06/22/2015) 30 capsule 11  . traMADol (ULTRAM) 50 MG tablet Take 1 tablet (50 mg total) by mouth every 6 (six) hours as needed. (Patient not taking: Reported on 06/22/2015) 30 tablet 0   No facility-administered medications prior to visit.    ROS Review of Systems  Constitutional: Positive for fatigue. Negative for fever, chills, diaphoresis, appetite change and unexpected weight change.  HENT: Negative.   Eyes: Negative.   Respiratory: Negative.   Negative for cough, choking, chest tightness, shortness of breath and stridor.   Cardiovascular: Negative.  Negative for chest pain, palpitations and leg swelling.  Gastrointestinal: Negative.  Negative for nausea, vomiting, abdominal pain, diarrhea and constipation.  Endocrine: Negative.   Genitourinary: Negative.   Musculoskeletal: Negative.  Negative for myalgias.  Skin: Negative.  Negative for rash.  Allergic/Immunologic: Negative.   Neurological: Negative.   Hematological: Negative.   Psychiatric/Behavioral: Negative.     Objective:  BP 96/60 mmHg  Pulse 66  Temp(Src) 98.1 F (36.7 C) (Oral)  Resp 12  Ht 5\' 2"  (1.575 m)  Wt 115 lb (52.164 kg)  BMI 21.03 kg/m2  SpO2 97%  Physical Exam  Constitutional: She is oriented to person, place, and time. She appears well-developed and well-nourished. No distress.  HENT:  Head: Normocephalic and atraumatic.  Mouth/Throat: Oropharynx is clear and moist. No oropharyngeal exudate.  Eyes: Conjunctivae are normal. Right eye exhibits no discharge. Left eye exhibits no discharge. No scleral icterus.  Neck: Normal range of motion. Neck supple. No JVD present. No tracheal deviation present.  Cardiovascular: Normal rate, regular rhythm, normal heart sounds and intact distal pulses.  Exam reveals no gallop and no friction rub.   No murmur heard. Pulmonary/Chest: Effort normal and breath sounds normal. No stridor. No respiratory distress. She has no wheezes. She has no rales. She exhibits no tenderness.  Abdominal: Soft. Bowel sounds are normal. She exhibits no distension and no mass. There is no tenderness. There is no rebound and no guarding.  Musculoskeletal: Normal range of motion. She exhibits no edema or tenderness.  Lymphadenopathy:    She has no cervical adenopathy.  Neurological: She is oriented to person, place, and time.  Skin: Skin is warm and dry. No rash noted. She is not diaphoretic. No erythema. No pallor.  Psychiatric: She has a  normal mood and affect. Her behavior is normal. Judgment and thought content normal.  Vitals reviewed.     Assessment & Plan:   Cynthia Ramsey was seen today for hypothyroidism.  Diagnoses and all orders for this visit:  Other specified hypothyroidism- her TSH is suppressed, she was taking an avg of about 96 mcg qd. I have asked her to lower her dose to 75 mcg qd Orders: -     Hemoglobin A1c; Future -     TSH; Future  Elevated LFTs- this appears to have been caused by the motrin use, will recheck her LFT's today, will also screen for viral hepatitis, she will avoid nsaids for now  Orders: -     Hemoglobin A1c; Future -     Comprehensive metabolic panel; Future -     Hepatitis A antibody, total; Future -     Hepatitis B core antibody, total; Future -     Hepatitis B surface antibody; Future -     Hepatitis C antibody; Future   I have discontinued Ms. Suttles Linaclotide, traMADol, levothyroxine, and levothyroxine. I am also having her start on levothyroxine. Additionally, I am having her maintain her multivitamin, Vitamin D, and fluticasone.  Meds ordered this encounter  Medications  . fluticasone (FLONASE) 50 MCG/ACT nasal spray    Sig: Place 1 spray into both nostrils daily.  Marland Kitchen levothyroxine (SYNTHROID) 75 MCG tablet    Sig: Take 1 tablet (75 mcg total) by mouth daily before breakfast.    Dispense:  90 tablet    Refill:  1     Follow-up: Return in about 4 months (around 10/23/2015).  Scarlette Calico, MD

## 2015-06-22 NOTE — Progress Notes (Signed)
Pre visit review using our clinic review tool, if applicable. No additional management support is needed unless otherwise documented below in the visit note. 

## 2015-06-23 ENCOUNTER — Encounter: Payer: Self-pay | Admitting: Internal Medicine

## 2015-12-03 ENCOUNTER — Other Ambulatory Visit: Payer: Self-pay | Admitting: Internal Medicine

## 2016-04-03 ENCOUNTER — Ambulatory Visit (INDEPENDENT_AMBULATORY_CARE_PROVIDER_SITE_OTHER): Payer: 59 | Admitting: Internal Medicine

## 2016-04-03 ENCOUNTER — Encounter: Payer: Self-pay | Admitting: Internal Medicine

## 2016-04-03 ENCOUNTER — Other Ambulatory Visit (INDEPENDENT_AMBULATORY_CARE_PROVIDER_SITE_OTHER): Payer: 59

## 2016-04-03 VITALS — BP 106/64 | HR 71 | Temp 98.0°F | Resp 16 | Wt 115.0 lb

## 2016-04-03 DIAGNOSIS — E038 Other specified hypothyroidism: Secondary | ICD-10-CM

## 2016-04-03 DIAGNOSIS — Z0000 Encounter for general adult medical examination without abnormal findings: Secondary | ICD-10-CM | POA: Diagnosis not present

## 2016-04-03 DIAGNOSIS — J302 Other seasonal allergic rhinitis: Secondary | ICD-10-CM | POA: Diagnosis not present

## 2016-04-03 LAB — COMPREHENSIVE METABOLIC PANEL
ALBUMIN: 4.2 g/dL (ref 3.5–5.2)
ALT: 20 U/L (ref 0–35)
AST: 17 U/L (ref 0–37)
Alkaline Phosphatase: 35 U/L — ABNORMAL LOW (ref 39–117)
BUN: 11 mg/dL (ref 6–23)
CHLORIDE: 104 meq/L (ref 96–112)
CO2: 29 mEq/L (ref 19–32)
CREATININE: 0.8 mg/dL (ref 0.40–1.20)
Calcium: 9 mg/dL (ref 8.4–10.5)
GFR: 84.4 mL/min (ref >60.00)
GLUCOSE: 90 mg/dL (ref 70–99)
Potassium: 3.8 mEq/L (ref 3.5–5.1)
SODIUM: 139 meq/L (ref 135–145)
TOTAL PROTEIN: 6.8 g/dL (ref 6.0–8.3)
Total Bilirubin: 0.4 mg/dL (ref 0.2–1.2)

## 2016-04-03 LAB — CBC WITH DIFFERENTIAL/PLATELET
BASOS ABS: 0.1 10*3/uL (ref 0.0–0.1)
BASOS PCT: 1.1 % (ref 0.0–3.0)
EOS PCT: 4.7 % (ref 0.0–5.0)
Eosinophils Absolute: 0.3 10*3/uL (ref 0.0–0.7)
HCT: 37.8 % (ref 36.0–46.0)
Hemoglobin: 12.9 g/dL (ref 12.0–15.0)
Lymphocytes Relative: 30.4 % (ref 12.0–46.0)
Lymphs Abs: 2.1 10*3/uL (ref 0.7–4.0)
MCHC: 34.1 g/dL (ref 30.0–36.0)
MCV: 89.9 fl (ref 78.0–100.0)
Monocytes Absolute: 0.5 10*3/uL (ref 0.1–1.0)
Monocytes Relative: 6.7 % (ref 3.0–12.0)
Neutro Abs: 4 10*3/uL (ref 1.4–7.7)
Neutrophils Relative %: 57.1 % (ref 43.0–77.0)
PLATELETS: 263 10*3/uL (ref 150.0–400.0)
RBC: 4.21 Mil/uL (ref 3.87–5.11)
RDW: 12.6 % (ref 11.5–15.5)
WBC: 6.9 10*3/uL (ref 4.0–10.5)

## 2016-04-03 LAB — TSH: TSH: 2.56 u[IU]/mL (ref 0.35–4.50)

## 2016-04-03 LAB — HEMOGLOBIN A1C: Hgb A1c MFr Bld: 5.3 % (ref 4.6–6.5)

## 2016-04-03 LAB — LIPID PANEL
CHOLESTEROL: 179 mg/dL (ref 0–200)
HDL: 53.6 mg/dL (ref >39.00)
LDL Cholesterol: 110 mg/dL — ABNORMAL HIGH (ref 0–99)
NonHDL: 125.81
TRIGLYCERIDES: 81 mg/dL (ref 0.0–149.0)
Total CHOL/HDL Ratio: 3
VLDL: 16.2 mg/dL (ref 0.0–40.0)

## 2016-04-03 MED ORDER — FLUTICASONE PROPIONATE 50 MCG/ACT NA SUSP: 1.0000 | Freq: Every day | NASAL | Status: DC

## 2016-04-03 MED ORDER — MONTELUKAST SODIUM 5 MG PO CHEW: 5.0000 mg | CHEWABLE_TABLET | Freq: Every day | ORAL | Status: DC

## 2016-04-03 MED FILL — FLUTICASONE PROP 50 MCG SPR: 50 | 30 days supply | Qty: 16 | Fill #0

## 2016-04-03 MED FILL — MONTELUKAST SOD 5 MG TAB CH: 5 | 30 days supply | Qty: 30 | Fill #0

## 2016-04-03 NOTE — Progress Notes (Signed)
Pre visit review using our clinic review tool, if applicable. No additional management support is needed unless otherwise documented below in the visit note. 

## 2016-04-03 NOTE — Assessment & Plan Note (Signed)
Continue Claritin, Flonase and Sudafed over-the-counter as needed Trial of Singulair 5 mg nightly for high pollen season

## 2016-04-03 NOTE — Patient Instructions (Signed)
  Test(s) ordered today. Your results will be released to MyChart (or called to you) after review, usually within 72hours after test completion. If any changes need to be made, you will be notified at that same time.  All other Health Maintenance issues reviewed.   All recommended immunizations and age-appropriate screenings are up-to-date or discussed.  No immunizations administered today.   Medications reviewed and updated.  No changes recommended at this time.  Your prescription(s) have been submitted to your pharmacy. Please take as directed and contact our office if you believe you are having problem(s) with the medication(s).     

## 2016-04-03 NOTE — Progress Notes (Signed)
    Subjective:    Patient ID: Cynthia Ramsey, female    DOB: [birth year], 40 y.o.   MRN: [id number]  HPI She is here to establish with a new pcp.  She is here for follow up.   Hypothyroidism:  She is taking her medication daily.  She denies any recent changes in energy or weight that are unexplained.   She is suffering with seasonal allergies. She is taking Claritin daily. She also takes Flonase, Sudafed as needed. She wondered about trying Singulair at night a low dose.  Medications and allergies reviewed with patient and updated if appropriate.  Patient Active Problem List   Diagnosis Date Noted  . Elevated LFTs 06/22/2015  . Chronic constipation   . HEADACHE 01/24/2011  . ALLERGIC RHINITIS 02/14/2010  . Hypothyroidism 02/07/2010    Current Outpatient Prescriptions on File Prior to Visit  Medication Sig Dispense Refill  . Cholecalciferol (VITAMIN D) 2000 UNITS tablet Take 1 tablet (2,000 Units total) by mouth daily. 30 tablet 11  . fluticasone (FLONASE) 50 MCG/ACT nasal spray Place 1 spray into both nostrils daily.    Marland Kitchen levothyroxine (SYNTHROID, LEVOTHROID) 75 MCG tablet TAKE 1 TABLET (75 MCG TOTAL) BY MOUTH DAILY BEFORE BREAKFAST. 90 tablet 0  . Multiple Vitamin (MULTIVITAMIN) tablet Take 1 tablet by mouth daily.      No current facility-administered medications on file prior to visit.    Past Medical History  Diagnosis Date  . ALLERGIC RHINITIS   . Headache(784.0)   . HYPOTHYROIDISM     Hashimoto's history    Past Surgical History  Procedure Laterality Date  . Child birth  06 & 09    x's 2     Social History   Social History  . Marital Status: Married    Spouse Name: N/A  . Number of Children: N/A  . Years of Education: N/A   Social History Main Topics  . Smoking status: Never Smoker   . 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 Alcohol Use: No  . Drug Use: No  . Sexual Activity: Not on  file   Other Topics Concern  . Not on file   Social History Narrative    Family History  Problem Relation Age of Onset  . Hyperlipidemia Mother   . Hypertension Mother   . Heart disease Father   . Atrial fibrillation Father     Review of Systems  Constitutional: Negative for fever.  Respiratory: Negative for cough, shortness of breath and wheezing.   Cardiovascular: Negative for chest pain, palpitations and leg swelling.  Neurological: Positive for headaches (occasional). Negative for light-headedness.       Objective:   Filed Vitals:   04/03/16 1306  BP: 106/64  Pulse: 71  Temp: 98 F (36.7 C)  Resp: 16   Filed Weights   04/03/16 1306  Weight: 115 lb (52.164 kg)   Body mass index is 21.03 kg/(m^2).   Physical Exam Constitutional: Appears well-developed and well-nourished. No distress.  Neck: Neck supple. No tracheal deviation present. No thyromegaly present.  No carotid bruit. No cervical adenopathy.   Cardiovascular: Normal rate, regular rhythm and normal heart sounds.   No murmur heard.  No edema Pulmonary/Chest: Effort normal and breath sounds normal. No respiratory distress. No wheezes.       Assessment & Plan:   See Problem List for Assessment and Plan of chronic medical problems.  F/u annually

## 2016-04-03 NOTE — Assessment & Plan Note (Signed)
Check tsh  Titrate med dose if needed  

## 2016-04-05 LAB — VITAMIN D 1,25 DIHYDROXY
VITAMIN D 1, 25 (OH) TOTAL: 69 pg/mL (ref 18–72)
VITAMIN D3 1, 25 (OH): 69 pg/mL

## 2016-04-06 ENCOUNTER — Encounter: Payer: Self-pay | Admitting: Internal Medicine

## 2016-04-06 DIAGNOSIS — Z1231 Encounter for screening mammogram for malignant neoplasm of breast: Secondary | ICD-10-CM

## 2016-04-06 MED ORDER — LEVOTHYROXINE SODIUM 75 MCG PO TABS: ORAL_TABLET | ORAL | Status: DC

## 2016-04-09 MED FILL — LEVOTHYROXINE 75 MCG TABLET: 75 | 90 days supply | Qty: 90 | Fill #0

## 2016-04-23 ENCOUNTER — Ambulatory Visit
Admission: RE | Admit: 2016-04-23 | Discharge: 2016-04-23 | Disposition: A | Discharge status: Home / Self Care | Payer: 59 | Source: Ambulatory Visit | Attending: Internal Medicine | Admitting: Internal Medicine

## 2016-04-23 DIAGNOSIS — Z1231 Encounter for screening mammogram for malignant neoplasm of breast: Secondary | ICD-10-CM

## 2016-04-25 ENCOUNTER — Other Ambulatory Visit: Payer: Self-pay | Admitting: Internal Medicine

## 2016-04-25 ENCOUNTER — Ambulatory Visit
Admission: RE | Admit: 2016-04-25 | Discharge: 2016-04-25 | Disposition: A | Discharge status: Home / Self Care | Payer: 59 | Source: Ambulatory Visit | Attending: Internal Medicine | Admitting: Internal Medicine

## 2016-04-25 DIAGNOSIS — N6489 Other specified disorders of breast: Secondary | ICD-10-CM | POA: Diagnosis not present

## 2016-04-25 DIAGNOSIS — R921 Mammographic calcification found on diagnostic imaging of breast: Secondary | ICD-10-CM

## 2016-04-25 DIAGNOSIS — R928 Other abnormal and inconclusive findings on diagnostic imaging of breast: Secondary | ICD-10-CM

## 2016-04-27 ENCOUNTER — Other Ambulatory Visit: Payer: Self-pay | Admitting: Internal Medicine

## 2016-04-27 ENCOUNTER — Ambulatory Visit
Admission: RE | Admit: 2016-04-27 | Discharge: 2016-04-27 | Disposition: A | Discharge status: Home / Self Care | Payer: 59 | Source: Ambulatory Visit | Attending: Internal Medicine | Admitting: Internal Medicine

## 2016-04-27 DIAGNOSIS — R921 Mammographic calcification found on diagnostic imaging of breast: Secondary | ICD-10-CM

## 2016-04-27 DIAGNOSIS — N63 Unspecified lump in breast: Secondary | ICD-10-CM | POA: Diagnosis not present

## 2016-04-27 DIAGNOSIS — N6011 Diffuse cystic mastopathy of right breast: Secondary | ICD-10-CM | POA: Diagnosis not present

## 2016-04-27 DIAGNOSIS — N6012 Diffuse cystic mastopathy of left breast: Secondary | ICD-10-CM | POA: Diagnosis not present

## 2016-04-27 HISTORY — PX: BREAST BIOPSY: SHX20

## 2016-04-30 ENCOUNTER — Encounter: Payer: Self-pay | Admitting: Internal Medicine

## 2016-04-30 DIAGNOSIS — R928 Other abnormal and inconclusive findings on diagnostic imaging of breast: Secondary | ICD-10-CM | POA: Insufficient documentation

## 2016-07-04 MED FILL — LEVOTHYROXINE 75 MCG TABLET: 75 | 90 days supply | Qty: 90 | Fill #1

## 2016-08-16 ENCOUNTER — Encounter: Payer: Self-pay | Admitting: Student

## 2016-08-28 MED FILL — FLUTICASONE PROP 50 MCG SPR: 50 | 30 days supply | Qty: 16 | Fill #1

## 2016-10-17 ENCOUNTER — Telehealth: Payer: Self-pay | Admitting: Neurology

## 2016-10-17 NOTE — Telephone Encounter (Signed)
Scheduled pt this Friday 10/19/16 at 8am for one hour per Dr Jaynee Eagles request. Lolly Mustache. Will call pt to discuss.

## 2016-10-17 NOTE — Telephone Encounter (Signed)
Cynthia Ramsey, will you place this patient in my 8am slot Friday morning for one hour and call her for details? She is a new refrral thank you !

## 2016-10-19 ENCOUNTER — Encounter: Payer: Self-pay | Admitting: Neurology

## 2016-10-19 ENCOUNTER — Ambulatory Visit (INDEPENDENT_AMBULATORY_CARE_PROVIDER_SITE_OTHER): Payer: 59 | Admitting: Neurology

## 2016-10-19 VITALS — BP 94/63 | HR 79 | Ht 62.0 in | Wt 120.5 lb

## 2016-10-19 DIAGNOSIS — G43009 Migraine without aura, not intractable, without status migrainosus: Secondary | ICD-10-CM | POA: Diagnosis not present

## 2016-10-19 MED ORDER — PROCHLORPERAZINE MALEATE 10 MG PO TABS: ORAL_TABLET | ORAL | 11 refills | Status: DC

## 2016-10-19 MED ORDER — TOPIRAMATE 25 MG PO TABS: 50.0000 mg | ORAL_TABLET | Freq: Every day | ORAL | 11 refills | Status: DC

## 2016-10-19 MED ORDER — RIZATRIPTAN BENZOATE 10 MG PO TBDP: 10.0000 mg | ORAL_TABLET | Freq: Once | ORAL | 11 refills | Status: DC

## 2016-10-19 MED ORDER — PREDNISONE 20 MG PO TABS: 20.0000 mg | ORAL_TABLET | Freq: Every day | ORAL | 0 refills | Status: DC

## 2016-10-19 MED FILL — PROCHLORPERAZINE 10 MG TAB: 10 | 9 days supply | Qty: 30 | Fill #0

## 2016-10-19 MED FILL — TOPIRAMATE 25 MG TABLET: 25 | 30 days supply | Qty: 60 | Fill #0

## 2016-10-19 MED FILL — predniSONE 20 MG TABS: 20 | 5 days supply | Qty: 5 | Fill #0

## 2016-10-19 NOTE — Patient Instructions (Signed)
Remember to drink plenty of fluid, eat healthy meals and do not skip any meals. Try to eat protein with a every meal and eat a healthy snack such as fruit or nuts in between meals. Try to keep a regular sleep-wake schedule and try to exercise daily, particularly in the form of walking, 20-30 minutes a day, if you can.   As far as your medications are concerned, I would like to suggest  1. Acute management: Maxalt: Please take one tablet at the onset of your headache. If it does not improve the symptoms please take one additional tablet in 2 hours. Do not take more then 2 tablets in 24hrs. Do not take use more then 2 to 3 days in a week. May take with Compazine, 2. Preventative management: Topiramate 25mg  at bedtime in 5 days increase to 50mg  at bedtime 3. Bridge: Stop Tylenol. Take 20mg  prednisone daily with breakfast for 5 days. Take Comapazine bid-tid for 5 days (after this can take compazone q6hrs prn for headache and nausea)  I would like to see you back in 6 weeks, sooner if we need to. Please call us with any interim questions, concerns, problems, updates or refill requests.   Our phone number is [phone]. We also have an after hours call service for urgent matters and there is a physician on-call for urgent questions. For any emergencies you know to call 911 or go to the nearest emergency room

## 2016-10-19 NOTE — Progress Notes (Signed)
[facility]    Provider:  Dr Jaynee Eagles Primary Care Physician:  Binnie Rail, MD  CC:  headaches  HPI:  Cynthia Ramsey is a 40 y.o. female here as a referral from Dr. Quay Burow for headaches/migraines. She has had migraines since 2003 without inciting event or head trauma. Her migraines start on the left side of the head, throbbing and behind the eye, nausea no vomiting. Light sensitivity, not sound, she wants to lie down in a dark room which can help. They can last all day. Can be severe 9/10, no triggers, she has taken excedrin for 5 years takes it once a day or more for 5 years. If she doesn't take it she will have a headache. Sleeping helps. No aura. She was having the migraines 1 every 1-2 weeks previous to starting excedrin daily, as headaches worsened she got into the habit of taking excedrin which has progressed. No blurry vision, no numbness or any other associated symptoms. She has never been treated for migraines. No other focal neurologic deficits.  Reviewed notes, labs and imaging from outside physicians, which showed:  HgbA1c 5.3, cbc nml, tsh nml, cmp unremarkable    CT head 2012 showed No acute intracranial abnormalities including mass lesion or mass effect, hydrocephalus, extra-axial fluid collection, midline shift, hemorrhage, or acute infarction, large ischemic events (personally reviewed images)    Review of Systems: Patient complains of symptoms per HPI as well as the following symptoms: no fevers, no chills. Pertinent negatives per HPI. All others negative.   Social History   Social History  . Marital status: Married    Spouse name: N/A  . Number of children: N/A  . Years of education: N/A   Occupational History  . physician    Social History Main Topics  . Smoking status: Never Smoker  . 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 Alcohol use No  . Drug use: No  . Sexual  activity: Not on file   Other Topics Concern  . Not on file   Social History Narrative   Lives with husband and kids   Caffeine use: 1 cup coffee on work days   Tea twice daily       Family History  Problem Relation Age of Onset  . Hyperlipidemia Mother   . Hypertension Mother   . Heart disease Father   . Atrial fibrillation Father   . Migraines Neg Hx     Past Medical History:  Diagnosis Date  . ALLERGIC RHINITIS   . Headache(784.0)   . HYPOTHYROIDISM    Hashimoto's history    Past Surgical History:  Procedure Laterality Date  . Child birth  06 & 09   x's 2   . RHINOPLASTY      Current Outpatient Prescriptions  Medication Sig Dispense Refill  . Cholecalciferol (VITAMIN D) 2000 UNITS tablet Take 1 tablet (2,000 Units total) by mouth daily. 30 tablet 11  . fluticasone (FLONASE) 50 MCG/ACT nasal spray Place 1 spray into both nostrils daily. 16 g 11  . Ginkgo Biloba 40 MG TABS Take by mouth daily.    Marland Kitchen levothyroxine (SYNTHROID, LEVOTHROID) 75 MCG tablet TAKE 1 TABLET (75 MCG TOTAL) BY MOUTH DAILY BEFORE BREAKFAST. 90 tablet 3  . loratadine (CLARITIN) 10 MG tablet Take 10 mg by mouth daily.    . Multiple Vitamin (MULTIVITAMIN) tablet Take 1 tablet by mouth daily.     Marland Kitchen  predniSONE (DELTASONE) 20 MG tablet Take 1 tablet (20 mg total) by mouth daily. 5 tablet 0  . prochlorperazine (COMPAZINE) 10 MG tablet Take tid for 5 days then q6hr prn 30 tablet 11  . rizatriptan (MAXALT-MLT) 10 MG disintegrating tablet Take 1 tablet (10 mg total) by mouth once. May repeat in 2 hours if needed 9 tablet 11  . topiramate (TOPAMAX) 25 MG tablet Take 2 tablets (50 mg total) by mouth at bedtime. 60 tablet 11   No current facility-administered medications for this visit.     Allergies as of 10/19/2016  . (No Known Allergies)    Vitals: BP 94/63 (BP Location: Left Arm, Patient Position: Sitting, Cuff Size: Normal)   Pulse 79   Ht 5\' 2"  (1.575 m)   Wt 120 lb 8 oz (54.7 kg)   BMI 22.04  kg/m  Last Weight:  Wt Readings from Last 1 Encounters:  10/19/16 120 lb 8 oz (54.7 kg)   Last Height:   Ht Readings from Last 1 Encounters:  10/19/16 5\' 2"  (1.575 m)    Physical exam: Exam: Gen: NAD, conversant, well nourised, well groomed                     CV: RRR, no MRG. No Carotid Bruits. No peripheral edema, warm, nontender Eyes: Conjunctivae clear without exudates or hemorrhage  Neuro: Detailed Neurologic Exam  Speech:    Speech is normal; fluent and spontaneous with normal comprehension.  Cognition:    The patient is oriented to person, place, and time;     recent and remote memory intact;     language fluent;     normal attention, concentration,     fund of knowledge Cranial Nerves:    The pupils are equal, round, and reactive to light. The fundi are normal and spontaneous venous pulsations are present. Visual fields are full to finger confrontation. Extraocular movements are intact. Trigeminal sensation is intact and the muscles of mastication are normal. The face is symmetric. The palate elevates in the midline. Hearing intact. Voice is normal. Shoulder shrug is normal. The tongue has normal motion without fasciculations.   Coordination:    Normal finger to nose and heel to shin. Normal rapid alternating movements.   Gait:    Heel-toe and tandem gait are normal.   Motor Observation:    No asymmetry, no atrophy, and no involuntary movements noted. Tone:    Normal muscle tone.    Posture:    Posture is normal. normal erect    Strength:    Strength is V/V in the upper and lower limbs.      Sensation: intact to LT     Reflex Exam:  DTR's:    Deep tendon reflexes in the upper and lower extremities are normal bilaterally.   Toes:    The toes are downgoing bilaterally.   Clonus:    Clonus is absent.      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  I had a long discussion with patient that her daily use of  excedrin can  cause medication overuse/rebound headache. They only thing to do is to stop the medication unfortunately. In the timeframe after stopping at her headaches may get much worse. I will give her some medication to try to bridge that. She should significantly  improve with her rebound headdaches after 2-4 weeks of being off her daily over-the-counter medications. Do not use these medications more than 2 times  in a week.  Migraines: Discussed migraine management including acute management and preventative management. We'll start patient on both. Discussed Topamax side effects especially teratogenicity do not get pregnant and use birth control.  Remember to drink plenty of fluid, eat healthy meals and do not skip any meals. Try to eat protein with a every meal and eat a healthy snack such as fruit or nuts in between meals. Try to keep a regular sleep-wake schedule and try to exercise daily, particularly in the form of walking, 20-30 minutes a day, if you can.   As far as your medications are concerned, I would like to suggest  1. Acute management: Maxalt: Please take one tablet at the onset of your headache. If it does not improve the symptoms please take one additional tablet in 2 hours. Do not take more then 2 tablets in 24hrs. Do not take use more then 2 to 3 days in a week. May take with Compazine, 2. Preventative management: Topiramate 25mg  at bedtime in 5 days increase to 50mg  at bedtime 3. Bridge: Stop Tylenol. Take 20mg  prednisone daily with breakfast for 5 days. Take Comapazine bid-tid for 5 days (after this can take compazone q6hrs prn for headache and nausea)  As far as diagnostic testing: If headaches persist need MRI brain  Discussed; To prevent or relieve headaches, try the following:  Cool Compress. Lie down and place a cool compress on your head.   Avoid headache triggers. If certain foods or odors seem to have triggered your migraines in the past, avoid them. A headache diary might  help you identify triggers.   Include physical activity in your daily routine. Try a daily walk or other moderate aerobic exercise.   Manage stress. Find healthy ways to cope with the stressors, such as delegating tasks on your to-do list.   Practice relaxation techniques. Try deep breathing, yoga, massage and visualization.   Eat regularly. Eating regularly scheduled meals and maintaining a healthy diet might help prevent headaches. Also, drink plenty of fluids.   Follow a regular sleep schedule. Sleep deprivation might contribute to headaches  Consider biofeedback. With this mind-body technique, you learn to control certain bodily functions - such as muscle tension, heart rate and blood pressure - to prevent headaches or reduce headache pain.    Proceed to emergency room if you experience new or worsening symptoms or symptoms do not resolve, if you have new neurologic symptoms or if headache is severe, or for any concerning symptom.     Sarina Ill, MD  [facility] [address] [place], [place] [address]  Phone [phone] Fax [phone]  A total of 60 minutes was spent face-to-face with this patient. Over half this time was spent on counseling patient on the migraine and medication oversue diagnosis and different diagnostic and therapeutic options available.

## 2016-10-21 ENCOUNTER — Encounter: Payer: Self-pay | Admitting: Neurology

## 2016-11-19 DIAGNOSIS — H524 Presbyopia: Secondary | ICD-10-CM | POA: Diagnosis not present

## 2016-11-20 MED FILL — TOPIRAMATE 25 MG TABLET: 25 | 30 days supply | Qty: 60 | Fill #1

## 2016-11-20 MED FILL — LEVOTHYROXINE 75 MCG TABLET: 75 | 90 days supply | Qty: 90 | Fill #2

## 2016-12-23 ENCOUNTER — Encounter: Payer: Self-pay | Admitting: Internal Medicine

## 2016-12-24 MED ORDER — PHENAZOPYRIDINE HCL 200 MG PO TABS: 200.0000 mg | ORAL_TABLET | Freq: Three times a day (TID) | ORAL | 0 refills | Status: DC | PRN

## 2016-12-24 NOTE — Telephone Encounter (Signed)
Please advise, does pt need to come to lab to give specimen?

## 2016-12-25 DIAGNOSIS — R319 Hematuria, unspecified: Secondary | ICD-10-CM | POA: Diagnosis not present

## 2016-12-25 DIAGNOSIS — Z118 Encounter for screening for other infectious and parasitic diseases: Secondary | ICD-10-CM | POA: Diagnosis not present

## 2016-12-25 DIAGNOSIS — R300 Dysuria: Secondary | ICD-10-CM | POA: Diagnosis not present

## 2016-12-25 DIAGNOSIS — Z113 Encounter for screening for infections with a predominantly sexual mode of transmission: Secondary | ICD-10-CM | POA: Diagnosis not present

## 2016-12-25 DIAGNOSIS — N898 Other specified noninflammatory disorders of vagina: Secondary | ICD-10-CM | POA: Diagnosis not present

## 2016-12-25 MED FILL — NITROFURANTOIN MONO-MCR 100: 100 | 7 days supply | Qty: 14 | Fill #0

## 2016-12-25 MED FILL — URO-MP CAPSULE: 118 | 5 days supply | Qty: 20 | Fill #0

## 2016-12-28 MED FILL — TOPIRAMATE 25 MG TABLET: 25 | 30 days supply | Qty: 60 | Fill #2

## 2017-01-22 MED FILL — RIZATRIPTAN 10 MG ODT: 10 | 30 days supply | Qty: 9 | Fill #0

## 2017-02-01 MED FILL — TOPIRAMATE 25 MG TABLET: 25 | 30 days supply | Qty: 60 | Fill #3

## 2017-02-04 MED FILL — FLUTICASONE PROP 50 MCG SPR: 50 | 30 days supply | Qty: 16 | Fill #2

## 2017-03-12 MED FILL — LEVOTHYROXINE 75 MCG TABLET: 75 | 90 days supply | Qty: 90 | Fill #3

## 2017-03-12 MED FILL — TOPIRAMATE 25 MG TABLET: 25 | 30 days supply | Qty: 60 | Fill #4

## 2017-04-29 MED FILL — TOPIRAMATE 25 MG TABLET: 25 | 30 days supply | Qty: 60 | Fill #5

## 2017-05-27 ENCOUNTER — Other Ambulatory Visit: Payer: Self-pay | Admitting: Internal Medicine

## 2017-05-27 DIAGNOSIS — Z1231 Encounter for screening mammogram for malignant neoplasm of breast: Secondary | ICD-10-CM

## 2017-06-03 DIAGNOSIS — G43909 Migraine, unspecified, not intractable, without status migrainosus: Secondary | ICD-10-CM | POA: Insufficient documentation

## 2017-06-03 NOTE — Progress Notes (Signed)
Subjective:    Patient ID: Cynthia Ramsey, female    DOB: 1976-07-16, 41 y.o.   MRN: [id number]  HPI She is here for a physical exam.   She has no concerns.  She is constipated and wonders about her thyroid.  She is exercising regularly.    Medications and allergies reviewed with patient and updated if appropriate.  Patient Active Problem List   Diagnosis Date Noted  . Migraines 06/03/2017  . Abnormal mammogram of both breasts 04/30/2016  . Elevated LFTs 06/22/2015  . Chronic constipation   . Allergic rhinitis 02/14/2010  . Hypothyroidism 02/07/2010    Current Outpatient Prescriptions on File Prior to Visit  Medication Sig Dispense Refill  . Cholecalciferol (VITAMIN D) 2000 UNITS tablet Take 1 tablet (2,000 Units total) by mouth daily. 30 tablet 11  . fluticasone (FLONASE) 50 MCG/ACT nasal spray Place 1 spray into both nostrils daily. 16 g 11  . Ginkgo Biloba 40 MG TABS Take by mouth daily.    Marland Kitchen levothyroxine (SYNTHROID, LEVOTHROID) 75 MCG tablet TAKE 1 TABLET (75 MCG TOTAL) BY MOUTH DAILY BEFORE BREAKFAST. 90 tablet 3  . loratadine (CLARITIN) 10 MG tablet Take 10 mg by mouth daily.    . Multiple Vitamin (MULTIVITAMIN) tablet Take 1 tablet by mouth daily.     Marland Kitchen topiramate (TOPAMAX) 25 MG tablet Take 2 tablets (50 mg total) by mouth at bedtime. 60 tablet 11  . rizatriptan (MAXALT-MLT) 10 MG disintegrating tablet Take 1 tablet (10 mg total) by mouth once. May repeat in 2 hours if needed 9 tablet 11   No current facility-administered medications on file prior to visit.     Past Medical History:  Diagnosis Date  . ALLERGIC RHINITIS   . Headache(784.0)   . HYPOTHYROIDISM    Hashimoto's history    Past Surgical History:  Procedure Laterality Date  . BREAST BIOPSY Right 04/27/2016   benign  . BREAST BIOPSY Left    benign  . Child birth  06 & 09   x's 2   . RHINOPLASTY      Social History   Social History  . Marital status: Married    Spouse name: N/A  . Number of  children: N/A  . Years of education: N/A   Occupational History  . physician    Social History Main Topics  . Smoking status: Never Smoker  . 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 Alcohol use No  . Drug use: No  . Sexual activity: Not Asked   Other Topics Concern  . None   Social History Narrative   Lives with husband and kids   Caffeine use: 1 cup coffee on work days   Tea twice daily       Family History  Problem Relation Age of Onset  . Hyperlipidemia Mother   . Hypertension Mother   . Heart disease Father   . Atrial fibrillation Father   . Migraines Neg Hx     Review of Systems  Constitutional: Negative for chills and fever.  Eyes: Negative for visual disturbance.  Respiratory: Negative for cough, shortness of breath and wheezing.   Cardiovascular: Negative for chest pain, palpitations and leg swelling.  Gastrointestinal: Positive for abdominal pain and constipation. Negative for blood in stool, diarrhea and nausea.  Genitourinary: Negative for dysuria and hematuria.  Musculoskeletal: Negative for arthralgias.  Skin: Negative for rash.  Neurological: Negative for light-headedness and  headaches.  Psychiatric/Behavioral: Negative for dysphoric mood. The patient is not nervous/anxious.        Objective:   Vitals:   06/04/17 1302  BP: 92/60  Pulse: 73  Resp: 16  Temp: 98.4 F (36.9 C)   Filed Weights   06/04/17 1302  Weight: 118 lb (53.5 kg)   Body mass index is 21.58 kg/m.  Wt Readings from Last 3 Encounters:  06/04/17 118 lb (53.5 kg)  10/19/16 120 lb 8 oz (54.7 kg)  04/03/16 115 lb (52.2 kg)     Physical Exam Constitutional: She appears well-developed and well-nourished. No distress.  HENT:  Head: Normocephalic and atraumatic.  Right Ear: External ear normal. Normal ear canal and TM Left Ear: External ear normal.  Normal ear canal and TM Mouth/Throat: Oropharynx is clear  and moist.  Eyes: Conjunctivae and EOM are normal.  Neck: Neck supple. No tracheal deviation present. No thyromegaly present.  No carotid bruit  Cardiovascular: Normal rate, regular rhythm and normal heart sounds.   No murmur heard.  No edema. Pulmonary/Chest: Effort normal and breath sounds normal. No respiratory distress. She has no wheezes. She has no rales.  Breast: deferred to Gyn Abdominal: Soft. She exhibits no distension. There is no tenderness.  Lymphadenopathy: She has no cervical adenopathy.  Skin: Skin is warm and dry. She is not diaphoretic.  Psychiatric: She has a normal mood and affect. Her behavior is normal.         Assessment & Plan:   Physical exam: Screening blood work ordered Immunizations -   Up to date  Mammogram  Up to date  Gyn   Up to date  Exercise  regular Weight    Normal BMI Skin   No concerns Substance abuse   none  See Problem List for Assessment and Plan of chronic medical problems.

## 2017-06-03 NOTE — Patient Instructions (Addendum)
Test(s) ordered today. Your results will be released to MyChart (or called to you) after review, usually within 72hours after test completion. If any changes need to be made, you will be notified at that same time.  All other Health Maintenance issues reviewed.   All recommended immunizations and age-appropriate screenings are up-to-date or discussed.  No immunizations administered today.   Medications reviewed and updated.  No changes recommended at this time.   Please followup in one year for a physical   Health Maintenance, Female Adopting a healthy lifestyle and getting preventive care can go a long way to promote health and wellness. Talk with your health care provider about what schedule of regular examinations is right for you. This is a good chance for you to check in with your provider about disease prevention and staying healthy. In between checkups, there are plenty of things you can do on your own. Experts have done a lot of research about which lifestyle changes and preventive measures are most likely to keep you healthy. Ask your health care provider for more information. Weight and diet Eat a healthy diet  Be sure to include plenty of vegetables, fruits, low-fat dairy products, and lean protein.  Do not eat a lot of foods high in solid fats, added sugars, or salt.  Get regular exercise. This is one of the most important things you can do for your health. ? Most adults should exercise for at least 150 minutes each week. The exercise should increase your heart rate and make you sweat (moderate-intensity exercise). ? Most adults should also do strengthening exercises at least twice a week. This is in addition to the moderate-intensity exercise.  Maintain a healthy weight  Body mass index (BMI) is a measurement that can be used to identify possible weight problems. It estimates body fat based on height and weight. Your health care provider can help determine your BMI and help  you achieve or maintain a healthy weight.  For females 20 years of age and older: ? A BMI below 18.5 is considered underweight. ? A BMI of 18.5 to 24.9 is normal. ? A BMI of 25 to 29.9 is considered overweight. ? A BMI of 30 and above is considered obese.  Watch levels of cholesterol and blood lipids  You should start having your blood tested for lipids and cholesterol at 41 years of age, then have this test every 5 years.  You may need to have your cholesterol levels checked more often if: ? Your lipid or cholesterol levels are high. ? You are older than 41 years of age. ? You are at high risk for heart disease.  Cancer screening Lung Cancer  Lung cancer screening is recommended for adults 55-80 years old who are at high risk for lung cancer because of a history of smoking.  A yearly low-dose CT scan of the lungs is recommended for people who: ? Currently smoke. ? Have quit within the past 15 years. ? Have at least a 30-pack-year history of smoking. A pack year is smoking an average of one pack of cigarettes a day for 1 year.  Yearly screening should continue until it has been 15 years since you quit.  Yearly screening should stop if you develop a health problem that would prevent you from having lung cancer treatment.  Breast Cancer  Practice breast self-awareness. This means understanding how your breasts normally appear and feel.  It also means doing regular breast self-exams. Let your health care provider know   know about any changes, no matter how small.  If you are in your 20s or 30s, you should have a clinical breast exam (CBE) by a health care provider every 1-3 years as part of a regular health exam.  If you are 52 or older, have a CBE every year. Also consider having a breast X-ray (mammogram) every year.  If you have a family history of breast cancer, talk to your health care provider about genetic screening.  If you are at high risk for breast cancer, talk to  your health care provider about having an MRI and a mammogram every year.  Breast cancer gene (BRCA) assessment is recommended for women who have family members with BRCA-related cancers. BRCA-related cancers include: ? Breast. ? Ovarian. ? Tubal. ? Peritoneal cancers.  Results of the assessment will determine the need for genetic counseling and BRCA1 and BRCA2 testing.  Cervical Cancer Your health care provider may recommend that you be screened regularly for cancer of the pelvic organs (ovaries, uterus, and vagina). This screening involves a pelvic examination, including checking for microscopic changes to the surface of your cervix (Pap test). You may be encouraged to have this screening done every 3 years, beginning at age 42.  For women ages 71-65, health care providers may recommend pelvic exams and Pap testing every 3 years, or they may recommend the Pap and pelvic exam, combined with testing for human papilloma virus (HPV), every 5 years. Some types of HPV increase your risk of cervical cancer. Testing for HPV may also be done on women of any age with unclear Pap test results.  Other health care providers may not recommend any screening for nonpregnant women who are considered low risk for pelvic cancer and who do not have symptoms. Ask your health care provider if a screening pelvic exam is right for you.  If you have had past treatment for cervical cancer or a condition that could lead to cancer, you need Pap tests and screening for cancer for at least 20 years after your treatment. If Pap tests have been discontinued, your risk factors (such as having a new sexual partner) need to be reassessed to determine if screening should resume. Some women have medical problems that increase the chance of getting cervical cancer. In these cases, your health care provider may recommend more frequent screening and Pap tests.  Colorectal Cancer  This type of cancer can be detected and often  prevented.  Routine colorectal cancer screening usually begins at 41 years of age and continues through 41 years of age.  Your health care provider may recommend screening at an earlier age if you have risk factors for colon cancer.  Your health care provider may also recommend using home test kits to check for hidden blood in the stool.  A small camera at the end of a tube can be used to examine your colon directly (sigmoidoscopy or colonoscopy). This is done to check for the earliest forms of colorectal cancer.  Routine screening usually begins at age 19.  Direct examination of the colon should be repeated every 5-10 years through 41 years of age. However, you may need to be screened more often if early forms of precancerous polyps or small growths are found.  Skin Cancer  Check your skin from head to toe regularly.  Tell your health care provider about any new moles or changes in moles, especially if there is a change in a mole's shape or color.  Also tell your health  care provider if you have a mole that is larger than the size of a pencil eraser.  Always use sunscreen. Apply sunscreen liberally and repeatedly throughout the day.  Protect yourself by wearing long sleeves, pants, a wide-brimmed hat, and sunglasses whenever you are outside.  Heart disease, diabetes, and high blood pressure  High blood pressure causes heart disease and increases the risk of stroke. High blood pressure is more likely to develop in: ? People who have blood pressure in the high end of the normal range (130-139/85-89 mm Hg). ? People who are overweight or obese. ? People who are African American.  If you are 18-39 years of age, have your blood pressure checked every 3-5 years. If you are 40 years of age or older, have your blood pressure checked every year. You should have your blood pressure measured twice-once when you are at a hospital or clinic, and once when you are not at a hospital or clinic.  Record the average of the two measurements. To check your blood pressure when you are not at a hospital or clinic, you can use: ? An automated blood pressure machine at a pharmacy. ? A home blood pressure monitor.  If you are between 55 years and 79 years old, ask your health care provider if you should take aspirin to prevent strokes.  Have regular diabetes screenings. This involves taking a blood sample to check your fasting blood sugar level. ? If you are at a normal weight and have a low risk for diabetes, have this test once every three years after 41 years of age. ? If you are overweight and have a high risk for diabetes, consider being tested at a younger age or more often. Preventing infection Hepatitis B  If you have a higher risk for hepatitis B, you should be screened for this virus. You are considered at high risk for hepatitis B if: ? You were born in a country where hepatitis B is common. Ask your health care provider which countries are considered high risk. ? Your parents were born in a high-risk country, and you have not been immunized against hepatitis B (hepatitis B vaccine). ? You have HIV or AIDS. ? You use needles to inject street drugs. ? You live with someone who has hepatitis B. ? You have had sex with someone who has hepatitis B. ? You get hemodialysis treatment. ? You take certain medicines for conditions, including cancer, organ transplantation, and autoimmune conditions.  Hepatitis C  Blood testing is recommended for: ? Everyone born from 1945 through 1965. ? Anyone with known risk factors for hepatitis C.  Sexually transmitted infections (STIs)  You should be screened for sexually transmitted infections (STIs) including gonorrhea and chlamydia if: ? You are sexually active and are younger than 41 years of age. ? You are older than 41 years of age and your health care provider tells you that you are at risk for this type of infection. ? Your sexual  activity has changed since you were last screened and you are at an increased risk for chlamydia or gonorrhea. Ask your health care provider if you are at risk.  If you do not have HIV, but are at risk, it may be recommended that you take a prescription medicine daily to prevent HIV infection. This is called pre-exposure prophylaxis (PrEP). You are considered at risk if: ? You are sexually active and do not regularly use condoms or know the HIV status of your partner(s). ? You   take drugs by injection. ? You are sexually active with a partner who has HIV.  Talk with your health care provider about whether you are at high risk of being infected with HIV. If you choose to begin PrEP, you should first be tested for HIV. You should then be tested every 3 months for as long as you are taking PrEP. Pregnancy  If you are premenopausal and you may become pregnant, ask your health care provider about preconception counseling.  If you may become pregnant, take 400 to 800 micrograms (mcg) of folic acid every day.  If you want to prevent pregnancy, talk to your health care provider about birth control (contraception). Osteoporosis and menopause  Osteoporosis is a disease in which the bones lose minerals and strength with aging. This can result in serious bone fractures. Your risk for osteoporosis can be identified using a bone density scan.  If you are 65 years of age or older, or if you are at risk for osteoporosis and fractures, ask your health care provider if you should be screened.  Ask your health care provider whether you should take a calcium or vitamin D supplement to lower your risk for osteoporosis.  Menopause may have certain physical symptoms and risks.  Hormone replacement therapy may reduce some of these symptoms and risks. Talk to your health care provider about whether hormone replacement therapy is right for you. Follow these instructions at home:  Schedule regular health, dental,  and eye exams.  Stay current with your immunizations.  Do not use any tobacco products including cigarettes, chewing tobacco, or electronic cigarettes.  If you are pregnant, do not drink alcohol.  If you are breastfeeding, limit how much and how often you drink alcohol.  Limit alcohol intake to no more than 1 drink per day for nonpregnant women. One drink equals 12 ounces of beer, 5 ounces of wine, or 1 ounces of hard liquor.  Do not use street drugs.  Do not share needles.  Ask your health care provider for help if you need support or information about quitting drugs.  Tell your health care provider if you often feel depressed.  Tell your health care provider if you have ever been abused or do not feel safe at home. This information is not intended to replace advice given to you by your health care provider. Make sure you discuss any questions you have with your health care provider. Document Released: 06/18/2011 Document Revised: 05/10/2016 Document Reviewed: 09/06/2015 Elsevier Interactive Patient Education  2018 Elsevier Inc.  

## 2017-06-04 ENCOUNTER — Ambulatory Visit (INDEPENDENT_AMBULATORY_CARE_PROVIDER_SITE_OTHER): Payer: 59 | Admitting: Internal Medicine

## 2017-06-04 ENCOUNTER — Encounter: Payer: Self-pay | Admitting: Internal Medicine

## 2017-06-04 ENCOUNTER — Other Ambulatory Visit (INDEPENDENT_AMBULATORY_CARE_PROVIDER_SITE_OTHER): Payer: 59

## 2017-06-04 ENCOUNTER — Telehealth: Payer: Self-pay | Admitting: *Deleted

## 2017-06-04 ENCOUNTER — Ambulatory Visit
Admission: RE | Admit: 2017-06-04 | Discharge: 2017-06-04 | Disposition: A | Discharge status: Home / Self Care | Payer: 59 | Source: Ambulatory Visit | Attending: Internal Medicine | Admitting: Internal Medicine

## 2017-06-04 VITALS — BP 92/60 | HR 73 | Temp 98.4°F | Resp 16 | Ht 62.0 in | Wt 118.0 lb

## 2017-06-04 DIAGNOSIS — Z1231 Encounter for screening mammogram for malignant neoplasm of breast: Secondary | ICD-10-CM | POA: Diagnosis not present

## 2017-06-04 DIAGNOSIS — Z0000 Encounter for general adult medical examination without abnormal findings: Secondary | ICD-10-CM

## 2017-06-04 DIAGNOSIS — E038 Other specified hypothyroidism: Secondary | ICD-10-CM

## 2017-06-04 DIAGNOSIS — G43809 Other migraine, not intractable, without status migrainosus: Secondary | ICD-10-CM | POA: Diagnosis not present

## 2017-06-04 LAB — COMPREHENSIVE METABOLIC PANEL
ALT: 11 U/L (ref 0–35)
AST: 15 U/L (ref 0–37)
Albumin: 4.3 g/dL (ref 3.5–5.2)
Alkaline Phosphatase: 43 U/L (ref 39–117)
BUN: 14 mg/dL (ref 6–23)
CALCIUM: 9.3 mg/dL (ref 8.4–10.5)
CHLORIDE: 106 meq/L (ref 96–112)
CO2: 27 mEq/L (ref 19–32)
CREATININE: 0.78 mg/dL (ref 0.40–1.20)
GFR: 86.4 mL/min (ref >60.00)
Glucose, Bld: 96 mg/dL (ref 70–99)
POTASSIUM: 4 meq/L (ref 3.5–5.1)
Sodium: 139 mEq/L (ref 135–145)
Total Bilirubin: 0.4 mg/dL (ref 0.2–1.2)
Total Protein: 6.6 g/dL (ref 6.0–8.3)

## 2017-06-04 LAB — TSH: TSH: 0.33 u[IU]/mL — ABNORMAL LOW (ref 0.35–4.50)

## 2017-06-04 LAB — CBC WITH DIFFERENTIAL/PLATELET
BASOS PCT: 0.8 % (ref 0.0–3.0)
Basophils Absolute: 0 10*3/uL (ref 0.0–0.1)
EOS PCT: 2.9 % (ref 0.0–5.0)
Eosinophils Absolute: 0.2 10*3/uL (ref 0.0–0.7)
HEMATOCRIT: 39.9 % (ref 36.0–46.0)
HEMOGLOBIN: 13.3 g/dL (ref 12.0–15.0)
Lymphocytes Relative: 27.2 % (ref 12.0–46.0)
Lymphs Abs: 1.6 10*3/uL (ref 0.7–4.0)
MCHC: 33.4 g/dL (ref 30.0–36.0)
MCV: 90.5 fl (ref 78.0–100.0)
MONOS PCT: 8.4 % (ref 3.0–12.0)
Monocytes Absolute: 0.5 10*3/uL (ref 0.1–1.0)
Neutro Abs: 3.7 10*3/uL (ref 1.4–7.7)
Neutrophils Relative %: 60.7 % (ref 43.0–77.0)
PLATELETS: 258 10*3/uL (ref 150.0–400.0)
RBC: 4.4 Mil/uL (ref 3.87–5.11)
RDW: 13.1 % (ref 11.5–15.5)
WBC: 6.1 10*3/uL (ref 4.0–10.5)

## 2017-06-04 LAB — VITAMIN D 25 HYDROXY (VIT D DEFICIENCY, FRACTURES): VITD: 51.21 ng/mL (ref 30.00–100.00)

## 2017-06-04 LAB — LIPID PANEL
CHOL/HDL RATIO: 3
Cholesterol: 168 mg/dL (ref 0–200)
HDL: 56.1 mg/dL (ref >39.00)
LDL CALC: 93 mg/dL (ref 0–99)
NonHDL: 112.28
Triglycerides: 98 mg/dL (ref 0.0–149.0)
VLDL: 19.6 mg/dL (ref 0.0–40.0)

## 2017-06-04 MED ORDER — OCUVITE-LUTEIN PO CAPS: 1.0000 | ORAL_CAPSULE | Freq: Every day | ORAL | 0 refills | Status: DC

## 2017-06-04 NOTE — Telephone Encounter (Signed)
Pharmacy updated.

## 2017-06-04 NOTE — Assessment & Plan Note (Signed)
Following with neurology  Overall controlled with current regimen

## 2017-06-04 NOTE — Telephone Encounter (Signed)
Patient was in office to see provider. Patient states she no longer goes to the [facility] . She goes to [facility] outpatient pharmacy. Please update. Thank you

## 2017-06-04 NOTE — Assessment & Plan Note (Signed)
Check tsh  Titrate med dose if needed  

## 2017-06-06 ENCOUNTER — Encounter: Payer: Self-pay | Admitting: Internal Medicine

## 2017-06-06 DIAGNOSIS — E038 Other specified hypothyroidism: Secondary | ICD-10-CM

## 2017-06-06 MED ORDER — LEVOTHYROXINE SODIUM 75 MCG PO TABS: ORAL_TABLET | ORAL | 3 refills | Status: DC

## 2017-06-06 MED FILL — LEVOTHYROXINE 75 MCG TABLET: 75 | 90 days supply | Qty: 90 | Fill #0

## 2017-06-07 ENCOUNTER — Other Ambulatory Visit: Payer: Self-pay | Admitting: Internal Medicine

## 2017-06-07 MED FILL — TOPIRAMATE 25 MG TABLET: 25 | 30 days supply | Qty: 60 | Fill #6

## 2017-07-02 ENCOUNTER — Ambulatory Visit: Payer: 59 | Admitting: Neurology

## 2017-07-18 ENCOUNTER — Ambulatory Visit (INDEPENDENT_AMBULATORY_CARE_PROVIDER_SITE_OTHER): Payer: 59 | Admitting: Neurology

## 2017-07-18 ENCOUNTER — Encounter: Payer: Self-pay | Admitting: Neurology

## 2017-07-18 DIAGNOSIS — G43709 Chronic migraine without aura, not intractable, without status migrainosus: Secondary | ICD-10-CM

## 2017-07-18 MED ORDER — TOPIRAMATE 100 MG PO TABS: 100.0000 mg | ORAL_TABLET | Freq: Every day | ORAL | 12 refills | Status: DC

## 2017-07-18 MED FILL — TOPIRAMATE 100 MG TABLET: 100 | 30 days supply | Qty: 30 | Fill #0

## 2017-07-18 NOTE — Progress Notes (Signed)
[facility]    Provider:  Dr Jaynee Eagles Referring Provider: Binnie Rail, MD Primary Care Physician:  Binnie Rail, MD  CC:  headaches  Interval headaches 07/18/2017: Patient stopped Excedrin with the help of Compazine and temporary steroids. She still continues to have more than 15 migraines a month, no medication overuse, no aura. At this time will increase topiramate.   HPI:  Cynthia Ramsey is a 41 y.o. female here as a referral from Dr. Quay Burow for headaches/migraines. She has had migraines since 2003 without inciting event or head trauma. Her migraines start on the left side of the head, throbbing and behind the eye, nausea no vomiting. Light sensitivity, not sound, she wants to lie down in a dark room which can help. They can last all day. Can be severe 9/10, no triggers, she has taken excedrin for 5 years takes it once a day or more for 5 years. If she doesn't take it she will have a headache. Sleeping helps. No aura. She was having the migraines 1 every 1-2 weeks previous to starting excedrin daily, as headaches worsened she got into the habit of taking excedrin which has progressed. No blurry vision, no numbness or any other associated symptoms. She has never been treated for migraines. No other focal neurologic deficits.  Reviewed notes, labs and imaging from outside physicians, which showed:  HgbA1c 5.3, cbc nml, tsh nml, cmp unremarkable   CT head 2012 showed No acute intracranial abnormalities including mass lesion or mass effect, hydrocephalus, extra-axial fluid collection, midline shift, hemorrhage, or acute infarction, large ischemic events (personally reviewed images)    Review of Systems: Patient complains of symptoms per HPI as well as the following symptoms: no fevers, no chills. Pertinent negatives per HPI. All others negative.  Social History   Social History  . Marital status: Married    Spouse name: N/A  . Number of children: 2  . Years  of education: N/A   Occupational History  . Physician    Social History Main Topics  . Smoking status: Never Smoker  . 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 Alcohol use No  . Drug use: No  . Sexual activity: Not on file   Other Topics Concern  . Not on file   Social History Narrative   Lives with husband and kids   Caffeine use: 1 cup coffee on work days   Tea twice daily       Family History  Problem Relation Age of Onset  . Hyperlipidemia Mother   . Hypertension Mother   . Heart disease Father   . Atrial fibrillation Father   . Migraines Neg Hx     Past Medical History:  Diagnosis Date  . ALLERGIC RHINITIS   . Headache(784.0)   . HYPOTHYROIDISM    Hashimoto's history    Past Surgical History:  Procedure Laterality Date  . BREAST BIOPSY Right 04/27/2016   benign  . BREAST BIOPSY Left    benign  . Child birth  06 & 09   x's 2   . RHINOPLASTY      Current Outpatient Prescriptions  Medication Sig Dispense Refill  . Cholecalciferol (VITAMIN D) 2000 UNITS tablet Take 1 tablet (2,000 Units total) by mouth daily. 30 tablet 11  . fluticasone (FLONASE) 50 MCG/ACT nasal spray PLACE 1 SPRAY INTO BOTH NOSTRILS DAILY. 16 g 11  . Ginkgo Biloba 40 MG TABS Take  by mouth daily.    Marland Kitchen levothyroxine (SYNTHROID, LEVOTHROID) 75 MCG tablet TAKE 1 TABLET (75 MCG TOTAL) BY MOUTH DAILY BEFORE BREAKFAST. 90 tablet 3  . loratadine (CLARITIN) 10 MG tablet Take 10 mg by mouth daily.    . Multiple Vitamin (MULTIVITAMIN) tablet Take 1 tablet by mouth daily.     . multivitamin-lutein (OCUVITE-LUTEIN) CAPS capsule Take 1 capsule by mouth daily.  0  . topiramate (TOPAMAX) 100 MG tablet Take 1 tablet (100 mg total) by mouth at bedtime. 30 tablet 12  . rizatriptan (MAXALT-MLT) 10 MG disintegrating tablet Take 1 tablet (10 mg total) by mouth once. May repeat in 2 hours if needed 9 tablet 11   No current  facility-administered medications for this visit.     Allergies as of 07/18/2017  . (No Known Allergies)    Vitals: BP 90/66   Pulse 64   Ht 5\' 2"  (1.575 m)   Wt 118 lb (53.5 kg)   BMI 21.58 kg/m  Last Weight:  Wt Readings from Last 1 Encounters:  07/18/17 118 lb (53.5 kg)   Last Height:   Ht Readings from Last 1 Encounters:  07/18/17 5\' 2"  (1.575 m)   Physical exam: Exam: Gen: NAD, conversant, well nourised, well groomed                     CV: RRR, no MRG. No Carotid Bruits. No peripheral edema, warm, nontender Eyes: Conjunctivae clear without exudates or hemorrhage  Neuro: Detailed Neurologic Exam  Speech:    Speech is normal; fluent and spontaneous with normal comprehension.  Cognition:    The patient is oriented to person, place, and time;     recent and remote memory intact;     language fluent;     normal attention, concentration,     fund of knowledge Cranial Nerves:    The pupils are equal, round, and reactive to light. The fundi are normal and spontaneous venous pulsations are present. Visual fields are full to finger confrontation. Extraocular movements are intact. Trigeminal sensation is intact and the muscles of mastication are normal. The face is symmetric. The palate elevates in the midline. Hearing intact. Voice is normal. Shoulder shrug is normal. The tongue has normal motion without fasciculations.   Coordination:    Normal finger to nose and heel to shin. Normal rapid alternating movements.   Gait:    Heel-toe and tandem gait are normal.   Motor Observation:    No asymmetry, no atrophy, and no involuntary movements noted. Tone:    Normal muscle tone.    Posture:    Posture is normal. normal erect    Strength:    Strength is V/V in the upper and lower limbs.      Sensation: intact to LT     Reflex Exam:  DTR's:    Deep tendon reflexes in the upper and lower extremities are normal bilaterally.   Toes:    The toes are downgoing  bilaterally.   Clonus:    Clonus is absent.  Assessment/Plan:   41 year old female with chronic migraines, without aura, not intractable without status migrainosus.  Migraines: Discussed migraine management including acute management and preventative management. We'll start patient on both. Discussed Topamax side effects especially teratogenicity do not get pregnant and use birth control. We'll increase topiramate at this time.  Remember to drink plenty of fluid, eat healthy meals and do not skip any meals. Try to eat protein with a every meal and  eat a healthy snack such as fruit or nuts in between meals. Try to keep a regular sleep-wake schedule and try to exercise daily, particularly in the form of walking, 20-30 minutes a day, if you can.   Discussed: To prevent or relieve headaches, try the following: Cool Compress. Lie down and place a cool compress on your head.  Avoid headache triggers. If certain foods or odors seem to have triggered your migraines in the past, avoid them. A headache diary might help you identify triggers.  Include physical activity in your daily routine. Try a daily walk or other moderate aerobic exercise.  Manage stress. Find healthy ways to cope with the stressors, such as delegating tasks on your to-do list.  Practice relaxation techniques. Try deep breathing, yoga, massage and visualization.  Eat regularly. Eating regularly scheduled meals and maintaining a healthy diet might help prevent headaches. Also, drink plenty of fluids.  Follow a regular sleep schedule. Sleep deprivation might contribute to headaches Consider biofeedback. With this mind-body technique, you learn to control certain bodily functions - such as muscle tension, heart rate and blood pressure - to prevent headaches or reduce headache pain.    Proceed to emergency room if you experience new or worsening symptoms or symptoms do not resolve, if you have new neurologic symptoms or if headache is  severe, or for any concerning symptom.   Provided education and documentation from American headache Society toolbox including articles on: chronic migraine medication overuse headache, chronic migraines, prevention of migraines, behavioral and other nonpharmacologic treatments for headache.    Sarina Ill, MD  [facility] [address] [place], [place] [address]  Phone [phone] Fax [phone]  A total of 15 minutes was spent face-to-face with this patient. Over half this time was spent on counseling patient on the chronic migraine diagnosis and different diagnostic and therapeutic options available.

## 2017-07-23 DIAGNOSIS — G43709 Chronic migraine without aura, not intractable, without status migrainosus: Secondary | ICD-10-CM | POA: Insufficient documentation

## 2017-07-24 ENCOUNTER — Ambulatory Visit (INDEPENDENT_AMBULATORY_CARE_PROVIDER_SITE_OTHER): Payer: Self-pay | Admitting: Neurology

## 2017-07-24 DIAGNOSIS — G43709 Chronic migraine without aura, not intractable, without status migrainosus: Secondary | ICD-10-CM

## 2017-07-25 NOTE — Progress Notes (Signed)
Patient left due to [facility] system being down

## 2017-09-17 MED FILL — LEVOTHYROXINE 75 MCG TABLET: 75 | 90 days supply | Qty: 90 | Fill #1

## 2017-09-18 MED FILL — FLUTICASONE PROP 50 MCG SPR: 50 | 60 days supply | Qty: 16 | Fill #0

## 2017-10-16 ENCOUNTER — Ambulatory Visit (INDEPENDENT_AMBULATORY_CARE_PROVIDER_SITE_OTHER): Payer: Self-pay | Admitting: Neurology

## 2017-10-16 DIAGNOSIS — Z0289 Encounter for other administrative examinations: Secondary | ICD-10-CM

## 2017-10-17 ENCOUNTER — Encounter: Payer: Self-pay | Admitting: Neurology

## 2017-12-02 NOTE — Progress Notes (Signed)
Subjective:    Patient ID: Cynthia Ramsey, female    DOB: 10/24/1976, 41 y.o.   MRN: [id number]  HPI The patient is here for follow up.  Hypothyroidism:  She is taking her medication daily.  She denies any recent changes in energy or weight that are unexplained.   Vitamin D deficiency: She is concerned about her overall bone health.  She has a history of vitamin D deficiency and is taking vitamin D daily.She is not currently exercising regularly, but typically does.   Medications and allergies reviewed with patient and updated if appropriate.  Patient Active Problem List   Diagnosis Date Noted  . Vitamin D deficiency 12/03/2017  . Chronic migraine without aura without status migrainosus, not intractable 07/23/2017  . Migraines 06/03/2017  . Abnormal mammogram of both breasts 04/30/2016  . Elevated LFTs 06/22/2015  . Chronic constipation   . Allergic rhinitis 02/14/2010  . Hypothyroidism 02/07/2010    Current Outpatient Medications on File Prior to Visit  Medication Sig Dispense Refill  . Cholecalciferol (VITAMIN D) 2000 UNITS tablet Take 1 tablet (2,000 Units total) by mouth daily. 30 tablet 11  . fluticasone (FLONASE) 50 MCG/ACT nasal spray PLACE 1 SPRAY INTO BOTH NOSTRILS DAILY. 16 g 11  . Ginkgo Biloba 40 MG TABS Take by mouth daily.    Marland Kitchen levothyroxine (SYNTHROID, LEVOTHROID) 75 MCG tablet TAKE 1 TABLET (75 MCG TOTAL) BY MOUTH DAILY BEFORE BREAKFAST. 90 tablet 3  . loratadine (CLARITIN) 10 MG tablet Take 10 mg by mouth daily.    . Multiple Vitamin (MULTIVITAMIN) tablet Take 1 tablet by mouth daily.     . multivitamin-lutein (OCUVITE-LUTEIN) CAPS capsule Take 1 capsule by mouth daily.  0  . rizatriptan (MAXALT-MLT) 10 MG disintegrating tablet Take 1 tablet (10 mg total) by mouth once. May repeat in 2 hours if needed 9 tablet 11   No current facility-administered medications on file prior to visit.     Past Medical History:  Diagnosis Date  . ALLERGIC RHINITIS   .  Headache(784.0)   . HYPOTHYROIDISM    Hashimoto's history    Past Surgical History:  Procedure Laterality Date  . BREAST BIOPSY Right 04/27/2016   benign  . BREAST BIOPSY Left    benign  . Child birth  06 & 09   x's 2   . RHINOPLASTY      Social History   Socioeconomic History  . Marital status: Married    Spouse name: None  . Number of children: 2  . Years of education: None  . Highest education level: None  Social Needs  . Financial resource strain: None  . Food insecurity - worry: None  . Food insecurity - inability: None  . Transportation needs - medical: None  . Transportation needs - non-medical: None  Occupational History  . Occupation: Physician  Tobacco Use  . Smoking status: Never Smoker  . Smokeless tobacco: Never Used  . 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 Alcohol use: No  . Drug use: No  . Sexual activity: None  Other Topics Concern  . None  Social History Narrative   Lives with husband and kids   Caffeine use: 1 cup coffee on work days   Tea twice daily    Family History  Problem Relation Age of Onset  . Hyperlipidemia Mother   . Hypertension Mother   . Heart disease Father   . Atrial fibrillation Father   .  Migraines Neg Hx     Review of Systems  Constitutional: Negative for chills, fatigue and fever.  Respiratory: Negative for cough, shortness of breath and wheezing.   Cardiovascular: Negative for chest pain, palpitations and leg swelling.  Neurological: Positive for headaches (Migraine headaches intermittent). Negative for light-headedness.       Objective:   Vitals:   12/03/17 1008  BP: 110/72  Pulse: 70  Resp: 16  Temp: 98.3 F (36.8 C)  SpO2: 95%   Wt Readings from Last 3 Encounters:  12/03/17 115 lb (52.2 kg)  07/18/17 118 lb (53.5 kg)  06/04/17 118 lb (53.5 kg)   Body mass index is 21.03 kg/m.   Physical Exam    Constitutional: Appears  well-developed and well-nourished. No distress.  HENT:  Head: Normocephalic and atraumatic.  Neck: Neck supple. No tracheal deviation present. No thyromegaly present.  No cervical lymphadenopathy Cardiovascular: Normal rate, regular rhythm and normal heart sounds.   No murmur heard. No carotid bruit .  No edema Pulmonary/Chest: Effort normal and breath sounds normal. No respiratory distress. No has no wheezes. No rales.  Skin: Skin is warm and dry. Not diaphoretic.  Psychiatric: Normal mood and affect. Behavior is normal.      Assessment & Plan:    See Problem List for Assessment and Plan of chronic medical problems.

## 2017-12-03 ENCOUNTER — Ambulatory Visit (INDEPENDENT_AMBULATORY_CARE_PROVIDER_SITE_OTHER): Payer: 59 | Admitting: Internal Medicine

## 2017-12-03 ENCOUNTER — Encounter: Payer: Self-pay | Admitting: Internal Medicine

## 2017-12-03 ENCOUNTER — Other Ambulatory Visit (INDEPENDENT_AMBULATORY_CARE_PROVIDER_SITE_OTHER): Payer: 59

## 2017-12-03 VITALS — BP 110/72 | HR 70 | Temp 98.3°F | Resp 16 | Wt 115.0 lb

## 2017-12-03 DIAGNOSIS — E038 Other specified hypothyroidism: Secondary | ICD-10-CM

## 2017-12-03 DIAGNOSIS — E559 Vitamin D deficiency, unspecified: Secondary | ICD-10-CM | POA: Insufficient documentation

## 2017-12-03 LAB — COMPREHENSIVE METABOLIC PANEL
ALK PHOS: 40 U/L (ref 39–117)
ALT: 11 U/L (ref 0–35)
AST: 14 U/L (ref 0–37)
Albumin: 4.4 g/dL (ref 3.5–5.2)
BILIRUBIN TOTAL: 0.5 mg/dL (ref 0.2–1.2)
BUN: 9 mg/dL (ref 6–23)
CALCIUM: 9.1 mg/dL (ref 8.4–10.5)
CO2: 29 mEq/L (ref 19–32)
CREATININE: 0.82 mg/dL (ref 0.40–1.20)
Chloride: 105 mEq/L (ref 96–112)
GFR: 81.35 mL/min (ref >60.00)
GLUCOSE: 89 mg/dL (ref 70–99)
Potassium: 4.1 mEq/L (ref 3.5–5.1)
Sodium: 140 mEq/L (ref 135–145)
TOTAL PROTEIN: 7.3 g/dL (ref 6.0–8.3)

## 2017-12-03 LAB — CBC WITH DIFFERENTIAL/PLATELET
BASOS ABS: 0 10*3/uL (ref 0.0–0.1)
Basophils Relative: 0.8 % (ref 0.0–3.0)
EOS PCT: 2.4 % (ref 0.0–5.0)
Eosinophils Absolute: 0.1 10*3/uL (ref 0.0–0.7)
HCT: 40.9 % (ref 36.0–46.0)
HEMOGLOBIN: 13.8 g/dL (ref 12.0–15.0)
Lymphocytes Relative: 31.3 % (ref 12.0–46.0)
Lymphs Abs: 1.9 10*3/uL (ref 0.7–4.0)
MCHC: 33.7 g/dL (ref 30.0–36.0)
MCV: 90.6 fl (ref 78.0–100.0)
MONOS PCT: 6.8 % (ref 3.0–12.0)
Monocytes Absolute: 0.4 10*3/uL (ref 0.1–1.0)
Neutro Abs: 3.5 10*3/uL (ref 1.4–7.7)
Neutrophils Relative %: 58.7 % (ref 43.0–77.0)
Platelets: 297 10*3/uL (ref 150.0–400.0)
RBC: 4.51 Mil/uL (ref 3.87–5.11)
RDW: 13.2 % (ref 11.5–15.5)
WBC: 6 10*3/uL (ref 4.0–10.5)

## 2017-12-03 LAB — T4, FREE: Free T4: 0.95 ng/dL (ref 0.60–1.60)

## 2017-12-03 LAB — VITAMIN D 25 HYDROXY (VIT D DEFICIENCY, FRACTURES): VITD: 67.96 ng/mL (ref 30.00–100.00)

## 2017-12-03 LAB — T3, FREE: T3 FREE: 3 pg/mL (ref 2.3–4.2)

## 2017-12-03 LAB — TSH: TSH: 1.61 u[IU]/mL (ref 0.35–4.50)

## 2017-12-03 LAB — PHOSPHORUS: PHOSPHORUS: 2.9 mg/dL (ref 2.3–4.6)

## 2017-12-03 NOTE — Patient Instructions (Signed)
  Test(s) ordered today. Your results will be released to MyChart (or called to you) after review, usually within 72hours after test completion. If any changes need to be made, you will be notified at that same time.    Medications reviewed and updated.  No changes recommended at this time.   Please followup once a year   

## 2017-12-03 NOTE — Assessment & Plan Note (Signed)
Taking vitamin D daily Check vitamin D level in addition to CMP's, CBC, phosphorus and PTH

## 2017-12-03 NOTE — Assessment & Plan Note (Signed)
Clinically euthyroid Check TSH, free T3 in 4, PTH, phosphorus, CMP, CBC and vitamin D

## 2017-12-04 ENCOUNTER — Encounter: Payer: Self-pay | Admitting: Internal Medicine

## 2017-12-04 LAB — PTH, INTACT AND CALCIUM
CALCIUM: 9.7 mg/dL (ref 8.6–10.2)
PTH: 54 pg/mL (ref 14–64)

## 2017-12-11 MED FILL — LEVOTHYROXINE 75 MCG TABLET: 75 | 90 days supply | Qty: 90 | Fill #2

## 2017-12-24 ENCOUNTER — Other Ambulatory Visit: Payer: Self-pay | Admitting: Neurology

## 2017-12-24 MED FILL — RIZATRIPTAN 10 MG ODT: 10 | 30 days supply | Qty: 9 | Fill #0

## 2017-12-25 ENCOUNTER — Other Ambulatory Visit: Payer: Self-pay | Admitting: Neurology

## 2017-12-25 MED ORDER — RIZATRIPTAN BENZOATE 10 MG PO TBDP: 10.0000 mg | ORAL_TABLET | ORAL | 12 refills | Status: DC | PRN

## 2018-01-28 ENCOUNTER — Other Ambulatory Visit: Payer: Self-pay | Admitting: Neurology

## 2018-01-29 ENCOUNTER — Other Ambulatory Visit: Payer: Self-pay | Admitting: Neurology

## 2018-01-29 MED ORDER — ALMOTRIPTAN MALATE 12.5 MG PO TABS: 12.5000 mg | ORAL_TABLET | ORAL | 11 refills | Status: DC | PRN

## 2018-01-29 MED ORDER — TRETINOIN 0.025 % EX CREA: TOPICAL_CREAM | Freq: Every day | CUTANEOUS | 11 refills | Status: DC

## 2018-03-19 MED FILL — LEVOTHYROXINE 75 MCG TABLET: 75 | 90 days supply | Qty: 90 | Fill #3

## 2018-04-01 MED FILL — VALACYCLOVIR HCL 500 MG TAB: 500 | 50 days supply | Qty: 100 | Fill #0

## 2018-04-30 DIAGNOSIS — Z6822 Body mass index (BMI) 22.0-22.9, adult: Secondary | ICD-10-CM | POA: Diagnosis not present

## 2018-04-30 DIAGNOSIS — Z23 Encounter for immunization: Secondary | ICD-10-CM | POA: Diagnosis not present

## 2018-04-30 DIAGNOSIS — Z01419 Encounter for gynecological examination (general) (routine) without abnormal findings: Secondary | ICD-10-CM | POA: Diagnosis not present

## 2018-04-30 DIAGNOSIS — Z1151 Encounter for screening for human papillomavirus (HPV): Secondary | ICD-10-CM | POA: Diagnosis not present

## 2018-06-05 ENCOUNTER — Other Ambulatory Visit: Payer: Self-pay | Admitting: Obstetrics

## 2018-06-05 DIAGNOSIS — Z1231 Encounter for screening mammogram for malignant neoplasm of breast: Secondary | ICD-10-CM

## 2018-06-08 ENCOUNTER — Other Ambulatory Visit: Payer: Self-pay | Admitting: Neurology

## 2018-06-08 MED ORDER — PREDNISONE 20 MG PO TABS: 60.0000 mg | ORAL_TABLET | Freq: Every day | ORAL | 1 refills | Status: DC

## 2018-06-13 ENCOUNTER — Other Ambulatory Visit: Payer: Self-pay | Admitting: Internal Medicine

## 2018-06-13 MED FILL — LEVOTHYROXINE 75 MCG TABLET: 75 | 90 days supply | Qty: 90 | Fill #0

## 2018-07-01 ENCOUNTER — Ambulatory Visit
Admission: RE | Admit: 2018-07-01 | Discharge: 2018-07-01 | Disposition: A | Discharge status: Home / Self Care | Payer: 59 | Source: Ambulatory Visit | Attending: Obstetrics | Admitting: Obstetrics

## 2018-07-01 DIAGNOSIS — Z1231 Encounter for screening mammogram for malignant neoplasm of breast: Secondary | ICD-10-CM

## 2018-07-14 DIAGNOSIS — H524 Presbyopia: Secondary | ICD-10-CM | POA: Diagnosis not present

## 2018-09-08 ENCOUNTER — Other Ambulatory Visit: Payer: Self-pay | Admitting: Internal Medicine

## 2018-09-08 MED FILL — LEVOTHYROXINE 75 MCG TABLET: 75 | 90 days supply | Qty: 90 | Fill #0

## 2018-10-28 ENCOUNTER — Encounter: Payer: Self-pay | Admitting: Internal Medicine

## 2018-10-28 NOTE — Telephone Encounter (Signed)
Documented flu shot.../LMB  

## 2018-11-27 ENCOUNTER — Ambulatory Visit: Payer: 59 | Admitting: Internal Medicine

## 2018-12-01 NOTE — Progress Notes (Signed)
Subjective:    Patient ID: Cynthia Ramsey, female    DOB: 1976-02-28, 42 y.o.   MRN: [id number]  HPI The patient is here for follow up.  Hypothyroidism:  She is taking her medication daily.  She denies any recent changes in energy or weight that are unexplained.   Migraine headaches: She does follow with neurology.  Overall her migraines are controlled.  She takes Excedrin or Maxalt as needed depending on if she is at work.  Maxalt does make her drowsy, but it is effective.  Medications and allergies reviewed with patient and updated if appropriate.  Patient Active Problem List   Diagnosis Date Noted  . Vitamin D deficiency 12/03/2017  . Chronic migraine without aura without status migrainosus, not intractable 07/23/2017  . Migraines 06/03/2017  . Abnormal mammogram of both breasts 04/30/2016  . Elevated LFTs 06/22/2015  . Chronic constipation   . Allergic rhinitis 02/14/2010  . Hypothyroidism 02/07/2010    Current Outpatient Medications on File Prior to Visit  Medication Sig Dispense Refill  . Cholecalciferol (VITAMIN D) 2000 UNITS tablet Take 1 tablet (2,000 Units total) by mouth daily. 30 tablet 11  . fluticasone (FLONASE) 50 MCG/ACT nasal spray PLACE 1 SPRAY INTO BOTH NOSTRILS DAILY. 16 g 11  . Ginkgo Biloba 40 MG TABS Take by mouth daily.    Marland Kitchen loratadine (CLARITIN) 10 MG tablet Take 10 mg by mouth daily.    . Multiple Vitamin (MULTIVITAMIN) tablet Take 1 tablet by mouth daily.     . multivitamin-lutein (OCUVITE-LUTEIN) CAPS capsule Take 1 capsule by mouth daily.  0   No current facility-administered medications on file prior to visit.     Past Medical History:  Diagnosis Date  . ALLERGIC RHINITIS   . Headache(784.0)   . HYPOTHYROIDISM    Hashimoto's history    Past Surgical History:  Procedure Laterality Date  . BREAST BIOPSY Right 04/27/2016   benign  . BREAST BIOPSY Left    benign  . Child birth  06 & 09   x's 2   . RHINOPLASTY      Social History    Socioeconomic History  . Marital status: Married    Spouse name: Not on file  . Number of children: 2  . Years of education: Not on file  . Highest education level: Not on file  Occupational History  . Occupation: Physician  Social Needs  . Financial resource strain: Not on file  . Food insecurity:    Worry: Not on file    Inability: Not on file  . Transportation needs:    Medical: Not on file    Non-medical: Not on file  Tobacco Use  . Smoking status: Never Smoker  . Smokeless tobacco: Never Used  . 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 Alcohol use: No  . Drug use: No  . Sexual activity: Not on file  Lifestyle  . Physical activity:    Days per week: Not on file    Minutes per session: Not on file  . Stress: Not on file  Relationships  . Social connections:    Talks on phone: Not on file    Gets together: Not on file    Attends religious service: Not on file    Active member of club or organization: Not on file    Attends meetings of clubs or organizations: Not on file    Relationship status: Not on  file  Other Topics Concern  . Not on file  Social History Narrative   Lives with husband and kids   Caffeine use: 1 cup coffee on work days   Tea twice daily    Family History  Problem Relation Age of Onset  . Hyperlipidemia Mother   . Hypertension Mother   . Heart disease Father   . Atrial fibrillation Father   . Migraines Neg Hx     Review of Systems  Constitutional: Negative for chills, fatigue and fever.  Respiratory: Negative for cough, shortness of breath and wheezing.   Cardiovascular: Negative for chest pain, palpitations and leg swelling.  Neurological: Negative for light-headedness and headaches.       Objective:   Vitals:   12/02/18 0940  BP: 104/68  Pulse: 64  Resp: 16  Temp: 98.5 F (36.9 C)  SpO2: 99%   BP Readings from Last 3 Encounters:  12/02/18 104/68   12/03/17 110/72  07/18/17 90/66   Wt Readings from Last 3 Encounters:  12/02/18 112 lb (50.8 kg)  12/03/17 115 lb (52.2 kg)  07/18/17 118 lb (53.5 kg)   Body mass index is 20.49 kg/m.   Physical Exam    Constitutional: Appears well-developed and well-nourished. No distress.  HENT:  Head: Normocephalic and atraumatic.  Neck: Neck supple. No tracheal deviation present. No thyromegaly present.  No cervical lymphadenopathy Cardiovascular: Normal rate, regular rhythm and normal heart sounds.   No murmur heard. No carotid bruit .  No edema Pulmonary/Chest: Effort normal and breath sounds normal. No respiratory distress. No has no wheezes. No rales.  Skin: Skin is warm and dry. Not diaphoretic.  Psychiatric: Normal mood and affect. Behavior is normal.      Assessment & Plan:    See Problem List for Assessment and Plan of chronic medical problems.

## 2018-12-02 ENCOUNTER — Ambulatory Visit: Payer: 59 | Admitting: Internal Medicine

## 2018-12-02 ENCOUNTER — Other Ambulatory Visit (INDEPENDENT_AMBULATORY_CARE_PROVIDER_SITE_OTHER): Payer: 59

## 2018-12-02 ENCOUNTER — Encounter: Payer: Self-pay | Admitting: Internal Medicine

## 2018-12-02 VITALS — BP 104/68 | HR 64 | Temp 98.5°F | Resp 16 | Ht 62.0 in | Wt 112.0 lb

## 2018-12-02 DIAGNOSIS — G43809 Other migraine, not intractable, without status migrainosus: Secondary | ICD-10-CM

## 2018-12-02 DIAGNOSIS — E038 Other specified hypothyroidism: Secondary | ICD-10-CM | POA: Diagnosis not present

## 2018-12-02 LAB — CBC WITH DIFFERENTIAL/PLATELET
Basophils Absolute: 0 10*3/uL (ref 0.0–0.1)
Basophils Relative: 0.8 % (ref 0.0–3.0)
Eosinophils Absolute: 0.2 10*3/uL (ref 0.0–0.7)
Eosinophils Relative: 3.6 % (ref 0.0–5.0)
HCT: 41.3 % (ref 36.0–46.0)
Hemoglobin: 14 g/dL (ref 12.0–15.0)
LYMPHS ABS: 2.1 10*3/uL (ref 0.7–4.0)
Lymphocytes Relative: 35.1 % (ref 12.0–46.0)
MCHC: 33.8 g/dL (ref 30.0–36.0)
MCV: 90.3 fl (ref 78.0–100.0)
Monocytes Absolute: 0.4 10*3/uL (ref 0.1–1.0)
Monocytes Relative: 7.1 % (ref 3.0–12.0)
Neutro Abs: 3.2 10*3/uL (ref 1.4–7.7)
Neutrophils Relative %: 53.4 % (ref 43.0–77.0)
PLATELETS: 275 10*3/uL (ref 150.0–400.0)
RBC: 4.57 Mil/uL (ref 3.87–5.11)
RDW: 13 % (ref 11.5–15.5)
WBC: 6 10*3/uL (ref 4.0–10.5)

## 2018-12-02 LAB — LIPID PANEL
Cholesterol: 178 mg/dL (ref 0–200)
HDL: 62.4 mg/dL (ref >39.00)
LDL Cholesterol: 101 mg/dL — ABNORMAL HIGH (ref 0–99)
NonHDL: 115.7
Total CHOL/HDL Ratio: 3
Triglycerides: 72 mg/dL (ref 0.0–149.0)
VLDL: 14.4 mg/dL (ref 0.0–40.0)

## 2018-12-02 LAB — COMPREHENSIVE METABOLIC PANEL
ALT: 15 U/L (ref 0–35)
AST: 17 U/L (ref 0–37)
Albumin: 4.3 g/dL (ref 3.5–5.2)
Alkaline Phosphatase: 39 U/L (ref 39–117)
BUN: 11 mg/dL (ref 6–23)
CO2: 27 mEq/L (ref 19–32)
Calcium: 9.5 mg/dL (ref 8.4–10.5)
Chloride: 106 mEq/L (ref 96–112)
Creatinine, Ser: 0.9 mg/dL (ref 0.40–1.20)
GFR: 72.72 mL/min (ref >60.00)
Glucose, Bld: 98 mg/dL (ref 70–99)
Potassium: 4.6 mEq/L (ref 3.5–5.1)
Sodium: 141 mEq/L (ref 135–145)
Total Bilirubin: 0.5 mg/dL (ref 0.2–1.2)
Total Protein: 6.7 g/dL (ref 6.0–8.3)

## 2018-12-02 LAB — TSH: TSH: 2.82 u[IU]/mL (ref 0.35–4.50)

## 2018-12-02 MED ORDER — RIZATRIPTAN BENZOATE 10 MG PO TBDP: 10.0000 mg | ORAL_TABLET | ORAL | 12 refills | Status: DC | PRN

## 2018-12-02 MED ORDER — LEVOTHYROXINE SODIUM 75 MCG PO TABS: ORAL_TABLET | ORAL | 3 refills | Status: DC

## 2018-12-02 MED FILL — RIZATRIPTAN 10 MG ODT: 10 | 30 days supply | Qty: 9 | Fill #0

## 2018-12-02 MED FILL — LEVOTHYROXINE 75 MCG TABLET: 75 | 90 days supply | Qty: 90 | Fill #0

## 2018-12-02 NOTE — Patient Instructions (Signed)
  Tests ordered today. Your results will be released to [facility] (or called to you) after review, usually within 72hours after test completion. If any changes need to be made, you will be notified at that same time.  Medications reviewed and updated.  Changes include :   none  Your prescription(s) have been submitted to your pharmacy. Please take as directed and contact our office if you believe you are having problem(s) with the medication(s).   Please followup in one year

## 2018-12-02 NOTE — Assessment & Plan Note (Addendum)
Clinically euthyroid Check tsh, TSH Titrate med dose if needed

## 2018-12-02 NOTE — Assessment & Plan Note (Addendum)
Following with neurology Takes Excedrin Migraine or Maxalt as needed for migraines Will refill Maxalt Check CBC, CMP

## 2018-12-29 DIAGNOSIS — Z23 Encounter for immunization: Secondary | ICD-10-CM | POA: Diagnosis not present

## 2018-12-29 DIAGNOSIS — T8332XA Displacement of intrauterine contraceptive device, initial encounter: Secondary | ICD-10-CM | POA: Diagnosis not present

## 2018-12-29 DIAGNOSIS — Z3202 Encounter for pregnancy test, result negative: Secondary | ICD-10-CM | POA: Diagnosis not present

## 2018-12-29 DIAGNOSIS — Z30433 Encounter for removal and reinsertion of intrauterine contraceptive device: Secondary | ICD-10-CM | POA: Diagnosis not present

## 2019-01-12 ENCOUNTER — Encounter: Payer: Self-pay | Admitting: Internal Medicine

## 2019-02-24 DIAGNOSIS — Z23 Encounter for immunization: Secondary | ICD-10-CM | POA: Diagnosis not present

## 2019-02-24 DIAGNOSIS — Z30431 Encounter for routine checking of intrauterine contraceptive device: Secondary | ICD-10-CM | POA: Diagnosis not present

## 2019-03-03 MED FILL — LEVOTHYROXINE 75 MCG TABLET: 75 | 90 days supply | Qty: 90 | Fill #1

## 2019-06-05 MED FILL — LEVOTHYROXINE 75 MCG TABLET: 75 | 90 days supply | Qty: 90 | Fill #2

## 2019-06-15 DIAGNOSIS — H524 Presbyopia: Secondary | ICD-10-CM | POA: Diagnosis not present

## 2019-06-18 ENCOUNTER — Encounter: Payer: Self-pay | Admitting: Internal Medicine

## 2019-06-22 ENCOUNTER — Ambulatory Visit (INDEPENDENT_AMBULATORY_CARE_PROVIDER_SITE_OTHER): Payer: 59 | Admitting: Internal Medicine

## 2019-06-22 ENCOUNTER — Other Ambulatory Visit: Payer: Self-pay

## 2019-06-22 ENCOUNTER — Other Ambulatory Visit (INDEPENDENT_AMBULATORY_CARE_PROVIDER_SITE_OTHER): Payer: 59

## 2019-06-22 ENCOUNTER — Encounter: Payer: Self-pay | Admitting: Internal Medicine

## 2019-06-22 VITALS — BP 102/62 | HR 64 | Temp 98.3°F | Resp 16 | Ht 62.0 in | Wt 109.0 lb

## 2019-06-22 DIAGNOSIS — E038 Other specified hypothyroidism: Secondary | ICD-10-CM | POA: Diagnosis not present

## 2019-06-22 DIAGNOSIS — R1032 Left lower quadrant pain: Secondary | ICD-10-CM | POA: Insufficient documentation

## 2019-06-22 LAB — COMPREHENSIVE METABOLIC PANEL
ALT: 11 U/L (ref 0–35)
AST: 14 U/L (ref 0–37)
Albumin: 4.5 g/dL (ref 3.5–5.2)
Alkaline Phosphatase: 39 U/L (ref 39–117)
BUN: 12 mg/dL (ref 6–23)
CO2: 27 mEq/L (ref 19–32)
Calcium: 8.7 mg/dL (ref 8.4–10.5)
Chloride: 107 mEq/L (ref 96–112)
Creatinine, Ser: 0.89 mg/dL (ref 0.40–1.20)
GFR: 69.12 mL/min (ref >60.00)
Glucose, Bld: 90 mg/dL (ref 70–99)
Potassium: 4 mEq/L (ref 3.5–5.1)
Sodium: 141 mEq/L (ref 135–145)
Total Bilirubin: 0.4 mg/dL (ref 0.2–1.2)
Total Protein: 6.8 g/dL (ref 6.0–8.3)

## 2019-06-22 LAB — TSH: TSH: 0.74 u[IU]/mL (ref 0.35–4.50)

## 2019-06-22 NOTE — Patient Instructions (Signed)
Have blood work done.    I will order a Ct scan.

## 2019-06-22 NOTE — Assessment & Plan Note (Signed)
Intermittent x 6 months Has lost weight Check cmp Abdominal / pelvic CT

## 2019-06-22 NOTE — Assessment & Plan Note (Signed)
Has had some weight loss ? Thyroid related Check tsh

## 2019-06-22 NOTE — Progress Notes (Signed)
Subjective:    Patient ID: Cynthia Ramsey, female    DOB: Dec 25, 1975, 43 y.o.   MRN: [id number]  HPI The patient is here for an acute visit.   She has been experiencing intermittent LLQ pain for 6 months.  It is a cramping sensation at times.  She has seen her GYN and it is not gynecological.    She has chronic constipation that is unchanged.  She controls it with taking colace as needed.  She denies blood in her stool.   She has had increased stress.  Her Dad died suddenly in 04-06-2023.  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  She has lost weight.  She is not sure if that is stress related or not.  She typically exercises, but has not been exercising for several months.      Hypothyroidism:  She is taking her medication daily.  Has has had weight loss, but does not feel it is related to her thyroid.    Medications and allergies reviewed with patient and updated if appropriate.  Patient Active Problem List   Diagnosis Date Noted  . Vitamin D deficiency 12/03/2017  . Chronic migraine without aura without status migrainosus, not intractable 07/23/2017  . Migraines 06/03/2017  . Abnormal mammogram of both breasts 04/30/2016  . Chronic constipation   . Allergic rhinitis 02/14/2010  . Hypothyroidism 02/07/2010    Current Outpatient Medications on File Prior to Visit  Medication Sig Dispense Refill  . Cholecalciferol (VITAMIN D) 2000 UNITS tablet Take 1 tablet (2,000 Units total) by mouth daily. 30 tablet 11  . fluticasone (FLONASE) 50 MCG/ACT nasal spray PLACE 1 SPRAY INTO BOTH NOSTRILS DAILY. 16 g 11  . Ginkgo Biloba 40 MG TABS Take by mouth daily.    Marland Kitchen levothyroxine (SYNTHROID, LEVOTHROID) 75 MCG tablet TAKE 1 TABLET BY MOUTH DAILY BEFORE BREAKFAST 90 tablet 3  . loratadine (CLARITIN) 10 MG tablet Take 10 mg by mouth daily.    . Multiple Vitamin (MULTIVITAMIN) tablet Take 1 tablet by mouth daily.     . multivitamin-lutein (OCUVITE-LUTEIN) CAPS capsule  Take 1 capsule by mouth daily.  0  . rizatriptan (MAXALT-MLT) 10 MG disintegrating tablet Take 1 tablet (10 mg total) by mouth as needed for migraine. May repeat in 2 hours if needed 9 tablet 12   No current facility-administered medications on file prior to visit.     Past Medical History:  Diagnosis Date  . ALLERGIC RHINITIS   . Headache(784.0)   . HYPOTHYROIDISM    Hashimoto's history    Past Surgical History:  Procedure Laterality Date  . BREAST BIOPSY Right 04/27/2016   benign  . BREAST BIOPSY Left    benign  . Child birth  06 & 09   x's 2   . RHINOPLASTY      Social History   Socioeconomic History  . Marital status: Married    Spouse name: Not on file  . Number of children: 2  . Years of education: Not on file  . Highest education level: Not on file  Occupational History  . Occupation: Physician  Social Needs  . Financial resource strain: Not on file  . Food insecurity    Worry: Not on file    Inability: Not on file  . Transportation needs    Medical: Not on file    Non-medical: Not on file  Tobacco Use  . Smoking status: Never Smoker  . Smokeless tobacco: Never Used  .  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 Alcohol use: No  . Drug use: No  . Sexual activity: Not on file  Lifestyle  . Physical activity    Days per week: Not on file    Minutes per session: Not on file  . Stress: Not on file  Relationships  . Social Herbalist on phone: Not on file    Gets together: Not on file    Attends religious service: Not on file    Active member of club or organization: Not on file    Attends meetings of clubs or organizations: Not on file    Relationship status: Not on file  Other Topics Concern  . Not on file  Social History Narrative   Lives with husband and kids   Caffeine use: 1 cup coffee on work days   Tea twice daily    Family History  Problem Relation Age of  Onset  . Hyperlipidemia Mother   . Hypertension Mother   . Heart disease Father   . Atrial fibrillation Father   . Migraines Neg Hx     Review of Systems  Constitutional: Negative for chills and fever.  Respiratory: Negative for cough, shortness of breath and wheezing.   Gastrointestinal: Positive for constipation. Negative for blood in stool, diarrhea and nausea.  Genitourinary: Negative for dysuria and hematuria.  Neurological: Negative for dizziness.       Objective:   Vitals:   06/22/19 1355  BP: 102/62  Pulse: 64  Resp: 16  Temp: 98.3 F (36.8 C)  SpO2: 99%   BP Readings from Last 3 Encounters:  06/22/19 102/62  12/02/18 104/68  12/03/17 110/72   Wt Readings from Last 3 Encounters:  06/22/19 109 lb (49.4 kg)  12/02/18 112 lb (50.8 kg)  12/03/17 115 lb (52.2 kg)   Body mass index is 19.94 kg/m.   Physical Exam         Assessment & Plan:    See Problem List for Assessment and Plan of chronic medical problems.

## 2019-06-22 NOTE — Telephone Encounter (Signed)
Appointment made for today

## 2019-06-23 ENCOUNTER — Encounter: Payer: Self-pay | Admitting: Internal Medicine

## 2019-06-24 ENCOUNTER — Encounter: Payer: Self-pay | Admitting: Internal Medicine

## 2019-06-24 ENCOUNTER — Ambulatory Visit
Admission: RE | Admit: 2019-06-24 | Discharge: 2019-06-24 | Disposition: A | Discharge status: Home / Self Care | Payer: 59 | Source: Ambulatory Visit | Attending: Internal Medicine | Admitting: Internal Medicine

## 2019-06-24 DIAGNOSIS — R634 Abnormal weight loss: Secondary | ICD-10-CM | POA: Diagnosis not present

## 2019-06-24 DIAGNOSIS — Z23 Encounter for immunization: Secondary | ICD-10-CM | POA: Diagnosis not present

## 2019-06-24 DIAGNOSIS — R1032 Left lower quadrant pain: Secondary | ICD-10-CM

## 2019-06-24 MED ORDER — IOPAMIDOL (ISOVUE-300) INJECTION 61%
100.0000 mL | Freq: Once | INTRAVENOUS | Status: AC | PRN
  Administered 2019-06-24: 100 mL via INTRAVENOUS

## 2019-06-25 ENCOUNTER — Ambulatory Visit: Payer: 59 | Admitting: Neurology

## 2019-06-25 ENCOUNTER — Other Ambulatory Visit: Payer: Self-pay

## 2019-06-25 VITALS — Temp 98.6°F

## 2019-06-25 DIAGNOSIS — G43709 Chronic migraine without aura, not intractable, without status migrainosus: Secondary | ICD-10-CM | POA: Diagnosis not present

## 2019-06-25 NOTE — Progress Notes (Signed)
Botox- 200 units x 1 vial Lot: [id number] Expiration: 02/2022 NDC: 9381-0175-10  Bacteriostatic 0.9% Sodium Chloride- 71mL total Lot: [id number] Expiration: 09/17/2019 NDC: 7824-2353-61  Dx: [id number] B/B

## 2019-07-05 NOTE — Progress Notes (Signed)
Consent Form Botulism Toxin Injection For Chronic Migraine    Reviewed orally with patient, additionally signature is on file:  Botulism toxin has been approved by the Federal drug administration for treatment of chronic migraine. Botulism toxin does not cure chronic migraine and it may not be effective in some patients.  The administration of botulism toxin is accomplished by injecting a small amount of toxin into the muscles of the neck and head. Dosage must be titrated for each individual. Any benefits resulting from botulism toxin tend to wear off after 3 months with a repeat injection required if benefit is to be maintained. Injections are usually done every 3-4 months with maximum effect peak achieved by about 2 or 3 weeks. Botulism toxin is expensive and you should be sure of what costs you will incur resulting from the injection.  The side effects of botulism toxin use for chronic migraine may include:   -Transient, and usually mild, facial weakness with facial injections  -Transient, and usually mild, head or neck weakness with head/neck injections  -Reduction or loss of forehead facial animation due to forehead muscle weakness  -Eyelid drooping  -Dry eye  -Pain at the site of injection or bruising at the site of injection  -Double vision  -Potential unknown long term risks  Contraindications: You should not have Botox if you are pregnant, nursing, allergic to albumin, have an infection, skin condition, or muscle weakness at the site of the injection, or have myasthenia gravis, Lambert-Eaton syndrome, or ALS.  It is also possible that as with any injection, there may be an allergic reaction or no effect from the medication. Reduced effectiveness after repeated injections is sometimes seen and rarely infection at the injection site may occur. All care will be taken to prevent these side effects. If therapy is given over a long time, atrophy and wasting in the muscle injected may  occur. Occasionally the patient's become refractory to treatment because they develop antibodies to the toxin. In this event, therapy needs to be modified.  I have read the above information and consent to the administration of botulism toxin.    BOTOX PROCEDURE NOTE FOR MIGRAINE HEADACHE    Contraindications and precautions discussed with patient(above). Aseptic procedure was observed and patient tolerated procedure. Procedure performed by Dr. Georgia Dom  The condition has existed for more than 6 months, and pt does not have a diagnosis of ALS, Myasthenia Gravis or Lambert-Eaton Syndrome.  Risks and benefits of injections discussed and pt agrees to proceed with the procedure.  Written consent obtained  These injections are medically necessary. Pt  receives good benefits from these injections. These injections do not cause sedations or hallucinations which the oral therapies may cause.  Description of procedure:  The patient was placed in a sitting position. The standard protocol was used for Botox as follows, with 5 units of Botox injected at each site:   -Procerus muscle, midline injection  -Corrugator muscle, bilateral injection  -Frontalis muscle, bilateral injection, with 2 sites each side, medial injection was performed in the upper one third of the frontalis muscle, in the region vertical from the medial inferior edge of the superior orbital rim. The lateral injection was again in the upper one third of the forehead vertically above the lateral limbus of the cornea, 1.5 cm lateral to the medial injection site.  - Levator Scapulae: 5 units bilaterally  -Temporalis muscle injection, 5 sites, bilaterally. The first injection was 3 cm above the tragus of the ear,  second injection site was 1.5 cm to 3 cm up from the first injection site in line with the tragus of the ear. The third injection site was 1.5-3 cm forward between the first 2 injection sites. The fourth injection site was  1.5 cm posterior to the second injection site. 5th site laterally in the temporalis  muscleat the level of the outer canthus.  - Patient feels her clenching is a trigger for headaches. +5 units masseter bilaterally   - Patient feels the migraines are centered around the eyes +5 units bilaterally at the outer canthus in the orbicularis occuli  -Occipitalis muscle injection, 3 sites, bilaterally. The first injection was done one half way between the occipital protuberance and the tip of the mastoid process behind the ear. The second injection site was done lateral and superior to the first, 1 fingerbreadth from the first injection. The third injection site was 1 fingerbreadth superiorly and medially from the first injection site.  -Cervical paraspinal muscle injection, 2 sites, bilateral knee first injection site was 1 cm from the midline of the cervical spine, 3 cm inferior to the lower border of the occipital protuberance. The second injection site was 1.5 cm superiorly and laterally to the first injection site.  -Trapezius muscle injection was performed at 3 sites, bilaterally. The first injection site was in the upper trapezius muscle halfway between the inflection point of the neck, and the acromion. The second injection site was one half way between the acromion and the first injection site. The third injection was done between the first injection site and the inflection point of the neck.   Will return for repeat injection in 3 months.   A 200 unit sof Botox was used, any Botox not injected was wasted. The patient tolerated the procedure well, there were no complications of the above procedure.

## 2019-07-06 ENCOUNTER — Ambulatory Visit: Payer: Self-pay | Admitting: Neurology

## 2019-07-09 ENCOUNTER — Other Ambulatory Visit: Payer: 59

## 2019-08-06 DIAGNOSIS — Z01419 Encounter for gynecological examination (general) (routine) without abnormal findings: Secondary | ICD-10-CM | POA: Diagnosis not present

## 2019-08-06 DIAGNOSIS — Z6822 Body mass index (BMI) 22.0-22.9, adult: Secondary | ICD-10-CM | POA: Diagnosis not present

## 2019-08-10 ENCOUNTER — Other Ambulatory Visit: Payer: Self-pay | Admitting: Obstetrics

## 2019-08-10 DIAGNOSIS — Z1231 Encounter for screening mammogram for malignant neoplasm of breast: Secondary | ICD-10-CM

## 2019-08-17 ENCOUNTER — Other Ambulatory Visit: Payer: Self-pay | Admitting: Internal Medicine

## 2019-08-17 MED FILL — FLUTICASONE PROP 50 MCG SPR: 50 | 60 days supply | Qty: 16 | Fill #0

## 2019-09-08 ENCOUNTER — Telehealth: Payer: Self-pay

## 2019-09-08 MED FILL — LEVOTHYROXINE 75 MCG TABLET: 75 | 90 days supply | Qty: 90 | Fill #3

## 2019-09-08 NOTE — Telephone Encounter (Signed)
Patient called to check test results.  Advised no results at this time.

## 2019-09-16 ENCOUNTER — Telehealth: Payer: Self-pay

## 2019-09-16 NOTE — Telephone Encounter (Signed)
Patient called to check Covid results.  Advised no results at this time.

## 2019-09-17 ENCOUNTER — Other Ambulatory Visit: Payer: Self-pay

## 2019-09-17 ENCOUNTER — Ambulatory Visit
Admission: RE | Admit: 2019-09-17 | Discharge: 2019-09-17 | Disposition: A | Discharge status: Home / Self Care | Payer: 59 | Source: Ambulatory Visit | Attending: Obstetrics | Admitting: Obstetrics

## 2019-09-17 DIAGNOSIS — Z1231 Encounter for screening mammogram for malignant neoplasm of breast: Secondary | ICD-10-CM

## 2019-09-30 ENCOUNTER — Other Ambulatory Visit: Payer: Self-pay

## 2019-09-30 ENCOUNTER — Ambulatory Visit: Payer: 59 | Admitting: Neurology

## 2019-09-30 VITALS — Temp 98.2°F

## 2019-09-30 DIAGNOSIS — G43709 Chronic migraine without aura, not intractable, without status migrainosus: Secondary | ICD-10-CM

## 2019-09-30 NOTE — Progress Notes (Signed)
Botox- 200 units x 1 vial Lot: [id number] Expiration: 05/2022 NDC: [id number]  Bacteriostatic 0.9% Sodium Chloride- 4mL total Lot: [id number] Expiration: 06/16/2020 NDC: [id number]  Dx: [id number] B/B

## 2019-09-30 NOTE — Progress Notes (Signed)

## 2019-12-09 ENCOUNTER — Other Ambulatory Visit: Payer: Self-pay

## 2019-12-09 ENCOUNTER — Encounter: Payer: Self-pay | Admitting: Internal Medicine

## 2019-12-09 ENCOUNTER — Other Ambulatory Visit: Payer: Self-pay | Admitting: Internal Medicine

## 2019-12-09 MED ORDER — LEVOTHYROXINE SODIUM 75 MCG PO TABS: ORAL_TABLET | ORAL | 4 refills | Status: DC

## 2019-12-09 MED FILL — LEVOTHYROXINE 75 MCG TABLET: 75 | 90 days supply | Qty: 90 | Fill #0

## 2020-02-25 ENCOUNTER — Other Ambulatory Visit: Payer: Self-pay | Admitting: Neurology

## 2020-02-25 ENCOUNTER — Telehealth: Payer: Self-pay | Admitting: Neurology

## 2020-02-25 DIAGNOSIS — H539 Unspecified visual disturbance: Secondary | ICD-10-CM

## 2020-02-25 DIAGNOSIS — R51 Headache with orthostatic component, not elsewhere classified: Secondary | ICD-10-CM

## 2020-02-25 NOTE — Progress Notes (Signed)
Mri brain ordered, patient called, worsening positional headaches and vision changes. MRI brain due to concerning symptoms of morning headaches, positional headaches,vision changes  to look for space occupying mass, chiari or intracranial hypertension (pseudotumor).

## 2020-02-25 NOTE — Telephone Encounter (Signed)
no to the covid questions MR Brain w/wo contrast Dr. Lianne Bushy [facility] Auth: [place] Ref # [id number]. Patient is scheduled at [facility] for 03/02/20.

## 2020-02-29 MED FILL — LEVOTHYROXINE SODIUM 75 MCG: 75 | 90 days supply | Qty: 90 | Fill #1

## 2020-03-02 ENCOUNTER — Other Ambulatory Visit: Payer: Self-pay

## 2020-03-02 ENCOUNTER — Ambulatory Visit: Payer: 59

## 2020-03-02 DIAGNOSIS — R51 Headache with orthostatic component, not elsewhere classified: Secondary | ICD-10-CM | POA: Diagnosis not present

## 2020-03-02 DIAGNOSIS — H539 Unspecified visual disturbance: Secondary | ICD-10-CM

## 2020-03-02 MED ORDER — GADOBENATE DIMEGLUMINE 529 MG/ML IV SOLN
10.0000 mL | Freq: Once | INTRAVENOUS | Status: AC | PRN
  Administered 2020-03-02: 10 mL via INTRAVENOUS

## 2020-03-07 ENCOUNTER — Ambulatory Visit (INDEPENDENT_AMBULATORY_CARE_PROVIDER_SITE_OTHER): Payer: 59 | Admitting: Neurology

## 2020-03-07 ENCOUNTER — Other Ambulatory Visit: Payer: Self-pay

## 2020-03-07 VITALS — Temp 98.4°F

## 2020-03-07 DIAGNOSIS — G43709 Chronic migraine without aura, not intractable, without status migrainosus: Secondary | ICD-10-CM | POA: Diagnosis not present

## 2020-03-07 NOTE — Progress Notes (Signed)

## 2020-03-07 NOTE — Progress Notes (Signed)
Botox- 200 units x 1 vial Lot: [id number] Expiration: 11/2022 NDC: [id number]  Dysport 500 units x 1 vial 250 units  Lot: [id number] Exp: 09/15/2020 NDC: [id number]  Bacteriostatic 0.9% Sodium Chloride- 6 mL total Lot: [id number] Expiration: 03/17/2020 NDC: [id number]  Dx: [id number] B/B Botox Sample Dysport

## 2020-03-11 DIAGNOSIS — Z1152 Encounter for screening for COVID-19: Secondary | ICD-10-CM | POA: Diagnosis not present

## 2020-03-25 DIAGNOSIS — Z1159 Encounter for screening for other viral diseases: Secondary | ICD-10-CM | POA: Diagnosis not present

## 2020-06-02 ENCOUNTER — Telehealth: Payer: Self-pay | Admitting: Neurology

## 2020-06-02 NOTE — Telephone Encounter (Signed)
I called [facility] [phone]) and spoke with Heidi to make sure patient does not need PA for Botox. She states that [id number] and [phone] do not require PA or medical review. Reference [phone]. Patient is B/B.

## 2020-06-14 ENCOUNTER — Other Ambulatory Visit: Payer: Self-pay

## 2020-06-14 ENCOUNTER — Ambulatory Visit (INDEPENDENT_AMBULATORY_CARE_PROVIDER_SITE_OTHER): Payer: 59 | Admitting: Neurology

## 2020-06-14 DIAGNOSIS — G43709 Chronic migraine without aura, not intractable, without status migrainosus: Secondary | ICD-10-CM | POA: Diagnosis not present

## 2020-06-14 MED ORDER — RIZATRIPTAN BENZOATE 10 MG PO TBDP: 10.0000 mg | ORAL_TABLET | ORAL | 12 refills | Status: DC | PRN

## 2020-06-14 MED FILL — RIZATRIPTAN 10 MG ODT: 10 | 15 days supply | Qty: 9 | Fill #0

## 2020-06-14 NOTE — Progress Notes (Signed)
Consent Form Botulism Toxin Injection For Chronic Migraine  Doing extremely well > 50% decrease in migraine freq. +a.  Reviewed orally with patient, additionally signature is on file:  Botulism toxin has been approved by the Federal drug administration for treatment of chronic migraine. Botulism toxin does not cure chronic migraine and it may not be effective in some patients.  The administration of botulism toxin is accomplished by injecting a small amount of toxin into the muscles of the neck and head. Dosage must be titrated for each individual. Any benefits resulting from botulism toxin tend to wear off after 3 months with a repeat injection required if benefit is to be maintained. Injections are usually done every 3-4 months with maximum effect peak achieved by about 2 or 3 weeks. Botulism toxin is expensive and you should be sure of what costs you will incur resulting from the injection.  The side effects of botulism toxin use for chronic migraine may include:   -Transient, and usually mild, facial weakness with facial injections  -Transient, and usually mild, head or neck weakness with head/neck injections  -Reduction or loss of forehead facial animation due to forehead muscle weakness  -Eyelid drooping  -Dry eye  -Pain at the site of injection or bruising at the site of injection  -Double vision  -Potential unknown long term risks  Contraindications: You should not have Botox if you are pregnant, nursing, allergic to albumin, have an infection, skin condition, or muscle weakness at the site of the injection, or have myasthenia gravis, Lambert-Eaton syndrome, or ALS.  It is also possible that as with any injection, there may be an allergic reaction or no effect from the medication. Reduced effectiveness after repeated injections is sometimes seen and rarely infection at the injection site may occur. All care will be taken to prevent these side effects. If therapy is given over a long  time, atrophy and wasting in the muscle injected may occur. Occasionally the patient's become refractory to treatment because they develop antibodies to the toxin. In this event, therapy needs to be modified.  I have read the above information and consent to the administration of botulism toxin.    BOTOX PROCEDURE NOTE FOR MIGRAINE HEADACHE    Contraindications and precautions discussed with patient(above). Aseptic procedure was observed and patient tolerated procedure. Procedure performed by Dr. Georgia Dom  The condition has existed for more than 6 months, and pt does not have a diagnosis of ALS, Myasthenia Gravis or Lambert-Eaton Syndrome.  Risks and benefits of injections discussed and pt agrees to proceed with the procedure.  Written consent obtained  These injections are medically necessary. Pt  receives good benefits from these injections. These injections do not cause sedations or hallucinations which the oral therapies may cause.  Description of procedure:  The patient was placed in a sitting position. The standard protocol was used for Botox as follows, with 5 units of Botox injected at each site:   -Procerus muscle, midline injection  -Corrugator muscle, bilateral injection  -Frontalis muscle, bilateral injection, with 2 sites each side, medial injection was performed in the upper one third of the frontalis muscle, in the region vertical from the medial inferior edge of the superior orbital rim. The lateral injection was again in the upper one third of the forehead vertically above the lateral limbus of the cornea, 1.5 cm lateral to the medial injection site.  -Temporalis muscle injection, 4 sites, bilaterally. The first injection was 3 cm above the tragus of the  ear, second injection site was 1.5 cm to 3 cm up from the first injection site in line with the tragus of the ear. The third injection site was 1.5-3 cm forward between the first 2 injection sites. The fourth injection  site was 1.5 cm posterior to the second injection site.   -Occipitalis muscle injection, 3 sites, bilaterally. The first injection was done one half way between the occipital protuberance and the tip of the mastoid process behind the ear. The second injection site was done lateral and superior to the first, 1 fingerbreadth from the first injection. The third injection site was 1 fingerbreadth superiorly and medially from the first injection site.  -Cervical paraspinal muscle injection, 2 sites, bilateral knee first injection site was 1 cm from the midline of the cervical spine, 3 cm inferior to the lower border of the occipital protuberance. The second injection site was 1.5 cm superiorly and laterally to the first injection site.  -Trapezius muscle injection was performed at 3 sites, bilaterally. The first injection site was in the upper trapezius muscle halfway between the inflection point of the neck, and the acromion. The second injection site was one half way between the acromion and the first injection site. The third injection was done between the first injection site and the inflection point of the neck.   Will return for repeat injection in 3 months.   200 units of Botox was used, any Botox not injected was wasted. The patient tolerated the procedure well, there were no complications of the above procedure.

## 2020-06-14 NOTE — Progress Notes (Signed)
Botox- 200 units x 1 vial Lot: [id number] Expiration: 02/2023 NDC: 9563-8756-43  Bacteriostatic 0.9% Sodium Chloride- 29mL total Lot: [id number] Expiration: 06/16/2020 NDC: 8416-6063-01  Dx: S01.093  B/B

## 2020-06-15 MED FILL — LEVOTHYROXINE SODIUM 75 MCG: 75 | 90 days supply | Qty: 90 | Fill #2

## 2020-07-19 DIAGNOSIS — H524 Presbyopia: Secondary | ICD-10-CM | POA: Diagnosis not present

## 2020-07-29 DIAGNOSIS — Z1159 Encounter for screening for other viral diseases: Secondary | ICD-10-CM | POA: Diagnosis not present

## 2020-08-15 ENCOUNTER — Other Ambulatory Visit: Payer: Self-pay | Admitting: Obstetrics

## 2020-08-15 DIAGNOSIS — Z1231 Encounter for screening mammogram for malignant neoplasm of breast: Secondary | ICD-10-CM

## 2020-09-15 MED FILL — LEVOTHYROXINE 75 MCG TABLET: 75 | 90 days supply | Qty: 90 | Fill #3

## 2020-09-19 ENCOUNTER — Ambulatory Visit
Admission: RE | Admit: 2020-09-19 | Discharge: 2020-09-19 | Disposition: A | Discharge status: Home / Self Care | Payer: 59 | Source: Ambulatory Visit | Attending: Obstetrics | Admitting: Obstetrics

## 2020-09-19 ENCOUNTER — Other Ambulatory Visit: Payer: Self-pay

## 2020-09-19 DIAGNOSIS — Z1231 Encounter for screening mammogram for malignant neoplasm of breast: Secondary | ICD-10-CM

## 2020-09-20 ENCOUNTER — Ambulatory Visit: Payer: 59 | Admitting: Neurology

## 2020-09-20 DIAGNOSIS — G43709 Chronic migraine without aura, not intractable, without status migrainosus: Secondary | ICD-10-CM | POA: Diagnosis not present

## 2020-09-20 NOTE — Progress Notes (Signed)
Consent Form Botulism Toxin Injection For Chronic Migraine  09/20/2020: Doing extremely well > 50% decrease in migraine freq. +a.  Reviewed orally with patient, additionally signature is on file:  Botulism toxin has been approved by the Federal drug administration for treatment of chronic migraine. Botulism toxin does not cure chronic migraine and it may not be effective in some patients.  The administration of botulism toxin is accomplished by injecting a small amount of toxin into the muscles of the neck and head. Dosage must be titrated for each individual. Any benefits resulting from botulism toxin tend to wear off after 3 months with a repeat injection required if benefit is to be maintained. Injections are usually done every 3-4 months with maximum effect peak achieved by about 2 or 3 weeks. Botulism toxin is expensive and you should be sure of what costs you will incur resulting from the injection.  The side effects of botulism toxin use for chronic migraine may include:   -Transient, and usually mild, facial weakness with facial injections  -Transient, and usually mild, head or neck weakness with head/neck injections  -Reduction or loss of forehead facial animation due to forehead muscle weakness  -Eyelid drooping  -Dry eye  -Pain at the site of injection or bruising at the site of injection  -Double vision  -Potential unknown long term risks  Contraindications: You should not have Botox if you are pregnant, nursing, allergic to albumin, have an infection, skin condition, or muscle weakness at the site of the injection, or have myasthenia gravis, Lambert-Eaton syndrome, or ALS.  It is also possible that as with any injection, there may be an allergic reaction or no effect from the medication. Reduced effectiveness after repeated injections is sometimes seen and rarely infection at the injection site may occur. All care will be taken to prevent these side effects. If therapy is given  over a long time, atrophy and wasting in the muscle injected may occur. Occasionally the patient's become refractory to treatment because they develop antibodies to the toxin. In this event, therapy needs to be modified.  I have read the above information and consent to the administration of botulism toxin.    BOTOX PROCEDURE NOTE FOR MIGRAINE HEADACHE    Contraindications and precautions discussed with patient(above). Aseptic procedure was observed and patient tolerated procedure. Procedure performed by Dr. Georgia Dom  The condition has existed for more than 6 months, and pt does not have a diagnosis of ALS, Myasthenia Gravis or Lambert-Eaton Syndrome.  Risks and benefits of injections discussed and pt agrees to proceed with the procedure.  Written consent obtained  These injections are medically necessary. Pt  receives good benefits from these injections. These injections do not cause sedations or hallucinations which the oral therapies may cause.  Description of procedure:  The patient was placed in a sitting position. The standard protocol was used for Botox as follows, with 5 units of Botox injected at each site:   -Procerus muscle, midline injection  -Corrugator muscle, bilateral injection  -Frontalis muscle, bilateral injection, with 2 sites each side, medial injection was performed in the upper one third of the frontalis muscle, in the region vertical from the medial inferior edge of the superior orbital rim. The lateral injection was again in the upper one third of the forehead vertically above the lateral limbus of the cornea, 1.5 cm lateral to the medial injection site.  -Temporalis muscle injection, 4 sites, bilaterally. The first injection was 3 cm above the tragus of  the ear, second injection site was 1.5 cm to 3 cm up from the first injection site in line with the tragus of the ear. The third injection site was 1.5-3 cm forward between the first 2 injection sites. The fourth  injection site was 1.5 cm posterior to the second injection site.   -Occipitalis muscle injection, 3 sites, bilaterally. The first injection was done one half way between the occipital protuberance and the tip of the mastoid process behind the ear. The second injection site was done lateral and superior to the first, 1 fingerbreadth from the first injection. The third injection site was 1 fingerbreadth superiorly and medially from the first injection site.  -Cervical paraspinal muscle injection, 2 sites, bilateral knee first injection site was 1 cm from the midline of the cervical spine, 3 cm inferior to the lower border of the occipital protuberance. The second injection site was 1.5 cm superiorly and laterally to the first injection site.  -Trapezius muscle injection was performed at 3 sites, bilaterally. The first injection site was in the upper trapezius muscle halfway between the inflection point of the neck, and the acromion. The second injection site was one half way between the acromion and the first injection site. The third injection was done between the first injection site and the inflection point of the neck.   Will return for repeat injection in 3 months.   200 units of Botox was used, any Botox not injected was wasted. The patient tolerated the procedure well, there were no complications of the above procedure.

## 2020-09-20 NOTE — Progress Notes (Signed)
Botox- 200 units x 1 vial Lot: [id number] Expiration: 05/2023 NDC: 7972-8206-01  Bacteriostatic 0.9% Sodium Chloride- 92mL total Lot: [id number] Expiration: 09/16/2021 NDC: 9432-7614-70  Dx: L29.574 B/B

## 2020-09-28 ENCOUNTER — Ambulatory Visit (INDEPENDENT_AMBULATORY_CARE_PROVIDER_SITE_OTHER): Payer: 59

## 2020-09-28 ENCOUNTER — Ambulatory Visit: Payer: 59 | Admitting: Orthopaedic Surgery

## 2020-09-28 DIAGNOSIS — M25571 Pain in right ankle and joints of right foot: Secondary | ICD-10-CM

## 2020-09-28 DIAGNOSIS — S93431A Sprain of tibiofibular ligament of right ankle, initial encounter: Secondary | ICD-10-CM | POA: Diagnosis not present

## 2020-09-28 NOTE — Progress Notes (Signed)
Office Visit Note   Patient: Cynthia Ramsey           Date of Birth: 1976-03-23           MRN: [id number] Visit Date: 09/28/2020              Requested by: Binnie Rail, MD [place],  [place] [id number] PCP: Binnie Rail, MD   Assessment & Plan: Visit Diagnoses:  1. Pain in right ankle and joints of right foot   2. Sprain of tibiofibular ligament of right ankle, initial encounter     Plan: I am going to allow her to weight-bear as tolerated in a cam walking boot.  We will probably transition her to an ASO in 2 weeks from now we see her back.  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  We will see her back in 2 weeks but no x-rays are needed.  All questions and concerns were answered and addressed.  Follow-Up Instructions: Return in about 2 weeks (around 10/12/2020).   Orders:  Orders Placed This Encounter  Procedures  . XR Ankle Complete Right   No orders of the defined types were placed in this encounter.     Procedures: No procedures performed   Clinical Data: No additional findings.   Subjective: Chief Complaint  Patient presents with  . 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  This happened in [place].  Since then she has developed significant right ankle swelling.  She has had problems with weightbearing on that ankle and has needed to ice it down quite a bit.  We are seeing her this morning is working to assess the ankle itself.  She is never injured this ankle before.  She says it was just surprisingly an easy step that she was taking when she rolled her ankle.  She denies any numbness and tingling in her foot.  She denies any other acute  changes in medical status.  HPI  Review of Systems There is no listed fever, chills, nausea, vomiting  Objective: Vital Signs: There were no vitals taken for this visit.  Physical Exam She is alert and orient x3 and in no acute distress Ortho Exam Examination of her right ankle shows that it is clinically located.  She has abundant soft tissue swelling all around the lateral malleolus and at the anterior talofibular ligament and posterior lateral malleolus.  There is no medial tenderness or medial instability.  There is a lot of pain to palpation along the lateral aspect of her foot and ankle.  Again there is significant swelling and bruising. Specialty Comments:  No specialty comments available.  Imaging: XR Ankle Complete Right  Result Date: 09/28/2020 3 views of the right ankle show abundant lateral soft tissue swelling.  There is a small avulsion fracture that is nondisplaced of the tip of the lateral malleolus.  The ankle mortise is congruent.    PMFS History: Patient Active Problem List   Diagnosis Date Noted  . Left lower quadrant abdominal pain 06/22/2019  . Vitamin D deficiency 12/03/2017  . Chronic migraine  without aura without status migrainosus, not intractable 07/23/2017  . Migraines 06/03/2017  . Abnormal mammogram of both breasts 04/30/2016  . Chronic constipation   . Allergic rhinitis 02/14/2010  . Hypothyroidism 02/07/2010   Past Medical History:  Diagnosis Date  . ALLERGIC RHINITIS   . Headache(784.0)   . HYPOTHYROIDISM    Hashimoto's history    Family History  Problem Relation Age of Onset  . Hyperlipidemia Mother   . Hypertension Mother   . Heart disease Father   . Atrial fibrillation Father   . Migraines Neg Hx     Past Surgical History:  Procedure Laterality Date  . BREAST BIOPSY Right 04/27/2016   benign  . BREAST BIOPSY Left    benign  . Child birth  06 & 09   x's 2   . RHINOPLASTY     Social History   Occupational History  .  Occupation: Physician  Tobacco Use  . Smoking status: Never Smoker  . Smokeless tobacco: Never Used  . 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 Alcohol use: No  . Drug use: No  . Sexual activity: Not on file

## 2020-09-29 ENCOUNTER — Telehealth: Payer: Self-pay | Admitting: Orthopaedic Surgery

## 2020-09-29 NOTE — Telephone Encounter (Signed)
[employer] forms received. Sent to [employer]. 

## 2020-10-12 ENCOUNTER — Ambulatory Visit: Payer: 59 | Admitting: Orthopaedic Surgery

## 2020-10-12 ENCOUNTER — Encounter: Payer: Self-pay | Admitting: Orthopaedic Surgery

## 2020-10-12 DIAGNOSIS — S93431A Sprain of tibiofibular ligament of right ankle, initial encounter: Secondary | ICD-10-CM | POA: Diagnosis not present

## 2020-10-12 DIAGNOSIS — M25571 Pain in right ankle and joints of right foot: Secondary | ICD-10-CM | POA: Diagnosis not present

## 2020-10-12 NOTE — Progress Notes (Signed)
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 This is mainly of the talofibular ligament of the right side. She denies any medial tenderness. All laterally. She does have bruising that has gone down into her foot or ankle. She is having some midfoot pain. She is tolerating the cam walking boot.  On exam she does have bruising into her right foot and forefoot. There is pain when I stressed the midfoot on the right side but there is no instability. She has good range of motion of her right ankle but is very painful. There is no pain over the deltoid ligament but significant pain over the fibula and the anterior talofibular ligament.  At this point we will try to transition her to an ASO when she is comfortable. She will continue the cam walker until then with weightbearing as tolerated. I would like to reevaluate her in 2 weeks with a repeat exam. All questions and concerns were answered and addressed.

## 2020-10-26 ENCOUNTER — Ambulatory Visit: Payer: 59 | Admitting: Orthopaedic Surgery

## 2020-10-26 ENCOUNTER — Encounter: Payer: Self-pay | Admitting: Orthopaedic Surgery

## 2020-10-26 DIAGNOSIS — S93431A Sprain of tibiofibular ligament of right ankle, initial encounter: Secondary | ICD-10-CM

## 2020-10-26 DIAGNOSIS — M25571 Pain in right ankle and joints of right foot: Secondary | ICD-10-CM

## 2020-10-26 NOTE — Progress Notes (Signed)
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  She sustained an avulsion fracture of the tip of the lateral malleolus and a sprain of the anterior talofibular ligament.  She has been weightbearing as tolerated in a cam walking boot.  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  She does have an ASO with her which she is ready to transition to.  Examination of her right ankle still shows pain over the anterior talar-fibular ligament but no instability on exam.  There is no pain over the deltoid ligament.  There is pain over the tip of the lateral malleolus and still some bruising and swelling.  Her midfoot is stable on exam.  At this point she can transition to an ASO and will slowly increase her activities as comfort allows.  All questions and concerns were answered addressed.  Follow-up as needed.  She will transition out of the ASO when she is comfortable and pain-free.

## 2020-11-27 NOTE — Progress Notes (Signed)
Subjective:    Patient ID: Cynthia Ramsey, female    DOB: 1976/04/18, 44 y.o.   MRN: [id number]   This visit occurred during the SARS-CoV-2 public health emergency.  Safety protocols were in place, including screening questions prior to the visit, additional usage of staff PPE, and extensive cleaning of exam room while observing appropriate contact time as indicated for disinfecting solutions.    HPI She is here for a physical exam.    She had fractured her R lateral malleolus since she was here.  She has a brace on her ankle now.      Medications and allergies reviewed with patient and updated if appropriate.  Patient Active Problem List   Diagnosis Date Noted  . Left lower quadrant abdominal pain 06/22/2019  . Vitamin D deficiency 12/03/2017  . Migraines 06/03/2017  . Abnormal mammogram of both breasts 04/30/2016  . Chronic constipation   . Allergic rhinitis 02/14/2010  . Hypothyroidism 02/07/2010    Current Outpatient Medications on File Prior to Visit  Medication Sig Dispense Refill  . Cholecalciferol (VITAMIN D) 2000 UNITS tablet Take 1 tablet (2,000 Units total) by mouth daily. 30 tablet 11  . fluticasone (FLONASE) 50 MCG/ACT nasal spray INHALE 1 SPRAY INTO EACH NOSTRILS DAILY. 16 g 11  . levothyroxine (SYNTHROID) 75 MCG tablet TAKE 1 TABLET BY MOUTH DAILY BEFORE BREAKFAST 90 tablet 4  . loratadine (CLARITIN) 10 MG tablet Take 10 mg by mouth daily.    . Multiple Vitamin (MULTIVITAMIN) tablet Take 1 tablet by mouth daily.    . multivitamin-lutein (OCUVITE-LUTEIN) CAPS capsule Take 1 capsule by mouth daily.  0  . rizatriptan (MAXALT-MLT) 10 MG disintegrating tablet Take 1 tablet (10 mg total) by mouth as needed for migraine. May repeat in 2 hours if needed 9 tablet 12   No current facility-administered medications on file prior to visit.    Past Medical History:  Diagnosis Date  . ALLERGIC RHINITIS   . Headache(784.0)   . HYPOTHYROIDISM    Hashimoto's history     Past Surgical History:  Procedure Laterality Date  . BREAST BIOPSY Right 04/27/2016   benign  . BREAST BIOPSY Left    benign  . Child birth  06 & 09   x's 2   . RHINOPLASTY      Social History   Socioeconomic History  . Marital status: Married    Spouse name: Not on file  . Number of children: 2  . Years of education: Not on file  . Highest education level: Not on file  Occupational History  . Occupation: Physician  Tobacco Use  . Smoking status: Never Smoker  . Smokeless tobacco: Never Used  . 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 Alcohol use: No  . Drug use: No  . Sexual activity: Not on file  Other Topics Concern  . Not on file  Social History Narrative   Lives with husband and kids   Caffeine use: 1 cup coffee on work days   Tea twice daily   Social Determinants of Health   Financial Resource Strain: Not on file  Food Insecurity: Not on file  Transportation Needs: Not on file  Physical Activity: Not on file  Stress: Not on file  Social Connections: Not on file    Family History  Problem Relation Age of Onset  . Hyperlipidemia Mother   . Hypertension Mother   . Heart disease Father   .  Atrial fibrillation Father   . Migraines Neg Hx     Review of Systems  Constitutional: Negative for chills and fever.  Eyes: Negative for visual disturbance.  Respiratory: Negative for cough, shortness of breath and wheezing.   Cardiovascular: Negative for chest pain, palpitations and leg swelling.  Gastrointestinal: Positive for constipation. Negative for abdominal pain, diarrhea and nausea.       No gerd  Genitourinary: Negative for dysuria and hematuria.  Skin: Negative for color change and rash.  Neurological: Positive for headaches. Negative for dizziness and light-headedness.  Psychiatric/Behavioral: Negative for dysphoric mood. The patient is not nervous/anxious.         Objective:   Vitals:   11/28/20 0841  BP: 106/62  Pulse: 68  Temp: 98.4 F (36.9 C)  SpO2: 99%   There were no vitals filed for this visit. Body mass index is 19.94 kg/m.  BP Readings from Last 3 Encounters:  11/28/20 106/62  06/22/19 102/62  12/02/18 104/68    Wt Readings from Last 3 Encounters:  06/22/19 109 lb (49.4 kg)  12/02/18 112 lb (50.8 kg)  12/03/17 115 lb (52.2 kg)     Physical Exam Constitutional: She appears well-developed and well-nourished. No distress.  HENT:  Head: Normocephalic and atraumatic.  Right Ear: External ear normal. Normal ear canal and TM Left Ear: External ear normal.  Normal ear canal and TM Mouth/Throat: Oropharynx is clear and moist.  Eyes: Conjunctivae and EOM are normal.  Neck: Neck supple. No tracheal deviation present. No thyromegaly present.  No carotid bruit  Cardiovascular: Normal rate, regular rhythm and normal heart sounds.   No murmur heard.  No edema. Pulmonary/Chest: Effort normal and breath sounds normal. No respiratory distress. She has no wheezes. She has no rales.  Breast: deferred   Abdominal: Soft. She exhibits no distension. There is no tenderness.  Lymphadenopathy: She has no cervical adenopathy.  Skin: Skin is warm and dry. She is not diaphoretic.  Psychiatric: She has a normal mood and affect. Her behavior is normal.        Assessment & Plan:   Physical exam: Screening blood work    ordered Immunizations  Up to date  Mammogram  Up to date  Gyn  Up to date  Exercise  regular Weight    normal Substance abuse  none        See Problem List for Assessment and Plan of chronic medical problems.

## 2020-11-27 NOTE — Patient Instructions (Addendum)
Blood work was ordered.     Medications changes include :   none  Your prescription(s) have been submitted to your pharmacy.    A dexa scan was ordered.    Please followup in 1 year    Health Maintenance, Female Adopting a healthy lifestyle and getting preventive care are important in promoting health and wellness. Ask your health care provider about:  The right schedule for you to have regular tests and exams.  Things you can do on your own to prevent diseases and keep yourself healthy. What should I know about diet, weight, and exercise? Eat a healthy diet   Eat a diet that includes plenty of vegetables, fruits, low-fat dairy products, and lean protein.  Do not eat a lot of foods that are high in solid fats, added sugars, or sodium. Maintain a healthy weight Body mass index (BMI) is used to identify weight problems. It estimates body fat based on height and weight. Your health care provider can help determine your BMI and help you achieve or maintain a healthy weight. Get regular exercise Get regular exercise. This is one of the most important things you can do for your health. Most adults should:  Exercise for at least 150 minutes each week. The exercise should increase your heart rate and make you sweat (moderate-intensity exercise).  Do strengthening exercises at least twice a week. This is in addition to the moderate-intensity exercise.  Spend less time sitting. Even light physical activity can be beneficial. Watch cholesterol and blood lipids Have your blood tested for lipids and cholesterol at 44 years of age, then have this test every 5 years. Have your cholesterol levels checked more often if:  Your lipid or cholesterol levels are high.  You are older than 44 years of age.  You are at high risk for heart disease. What should I know about cancer screening? Depending on your health history and family history, you may need to have cancer screening at various  ages. This may include screening for:  Breast cancer.  Cervical cancer.  Colorectal cancer.  Skin cancer.  Lung cancer. What should I know about heart disease, diabetes, and high blood pressure? Blood pressure and heart disease  High blood pressure causes heart disease and increases the risk of stroke. This is more likely to develop in people who have high blood pressure readings, are of African descent, or are overweight.  Have your blood pressure checked: ? Every 3-5 years if you are 56-47 years of age. ? Every year if you are 53 years old or older. Diabetes Have regular diabetes screenings. This checks your fasting blood sugar level. Have the screening done:  Once every three years after age 27 if you are at a normal weight and have a low risk for diabetes.  More often and at a younger age if you are overweight or have a high risk for diabetes. What should I know about preventing infection? Hepatitis B If you have a higher risk for hepatitis B, you should be screened for this virus. Talk with your health care provider to find out if you are at risk for hepatitis B infection. Hepatitis C Testing is recommended for:  Everyone born from [birth year] through 1965.  Anyone with known risk factors for hepatitis C. Sexually transmitted infections (STIs)  Get screened for STIs, including gonorrhea and chlamydia, if: ? You are sexually active and are younger than 44 years of age. ? You are older than 44 years of age and  your health care provider tells you that you are at risk for this type of infection. ? Your sexual activity has changed since you were last screened, and you are at increased risk for chlamydia or gonorrhea. Ask your health care provider if you are at risk.  Ask your health care provider about whether you are at high risk for HIV. Your health care provider may recommend a prescription medicine to help prevent HIV infection. If you choose to take medicine to prevent HIV, you  should first get tested for HIV. You should then be tested every 3 months for as long as you are taking the medicine. Pregnancy  If you are about to stop having your period (premenopausal) and you may become pregnant, seek counseling before you get pregnant.  Take 400 to 800 micrograms (mcg) of folic acid every day if you become pregnant.  Ask for birth control (contraception) if you want to prevent pregnancy. Osteoporosis and menopause Osteoporosis is a disease in which the bones lose minerals and strength with aging. This can result in bone fractures. If you are 102 years old or older, or if you are at risk for osteoporosis and fractures, ask your health care provider if you should:  Be screened for bone loss.  Take a calcium or vitamin D supplement to lower your risk of fractures.  Be given hormone replacement therapy (HRT) to treat symptoms of menopause. Follow these instructions at home: Lifestyle  Do not use any products that contain nicotine or tobacco, such as cigarettes, e-cigarettes, and chewing tobacco. If you need help quitting, ask your health care provider.  Do not use street drugs.  Do not share needles.  Ask your health care provider for help if you need support or information about quitting drugs. Alcohol use  Do not drink alcohol if: ? Your health care provider tells you not to drink. ? You are pregnant, may be pregnant, or are planning to become pregnant.  If you drink alcohol: ? Limit how much you use to 0-1 drink a day. ? Limit intake if you are breastfeeding.  Be aware of how much alcohol is in your drink. In the [place] one drink equals one 12 oz bottle of beer (355 mL), one 5 oz glass of wine (148 mL), or one 1 oz glass of hard liquor (44 mL). General instructions  Schedule regular health, dental, and eye exams.  Stay current with your vaccines.  Tell your health care provider if: ? You often feel depressed. ? You have ever been abused or do not feel  safe at home. Summary  Adopting a healthy lifestyle and getting preventive care are important in promoting health and wellness.  Follow your health care provider's instructions about healthy diet, exercising, and getting tested or screened for diseases.  Follow your health care provider's instructions on monitoring your cholesterol and blood pressure. This information is not intended to replace advice given to you by your health care provider. Make sure you discuss any questions you have with your health care provider. Document Revised: 11/26/2018 Document Reviewed: 11/26/2018 Elsevier Patient Education  2020 [employer].

## 2020-11-28 ENCOUNTER — Other Ambulatory Visit: Payer: Self-pay | Admitting: Internal Medicine

## 2020-11-28 ENCOUNTER — Encounter: Payer: Self-pay | Admitting: Internal Medicine

## 2020-11-28 ENCOUNTER — Ambulatory Visit (INDEPENDENT_AMBULATORY_CARE_PROVIDER_SITE_OTHER): Payer: 59 | Admitting: Internal Medicine

## 2020-11-28 ENCOUNTER — Other Ambulatory Visit: Payer: Self-pay

## 2020-11-28 VITALS — BP 106/62 | HR 68 | Temp 98.4°F | Ht 62.0 in

## 2020-11-28 DIAGNOSIS — G43809 Other migraine, not intractable, without status migrainosus: Secondary | ICD-10-CM

## 2020-11-28 DIAGNOSIS — Z0000 Encounter for general adult medical examination without abnormal findings: Secondary | ICD-10-CM | POA: Diagnosis not present

## 2020-11-28 DIAGNOSIS — Z13820 Encounter for screening for osteoporosis: Secondary | ICD-10-CM

## 2020-11-28 DIAGNOSIS — S8264XA Nondisplaced fracture of lateral malleolus of right fibula, initial encounter for closed fracture: Secondary | ICD-10-CM | POA: Diagnosis not present

## 2020-11-28 DIAGNOSIS — E559 Vitamin D deficiency, unspecified: Secondary | ICD-10-CM | POA: Diagnosis not present

## 2020-11-28 DIAGNOSIS — E038 Other specified hypothyroidism: Secondary | ICD-10-CM | POA: Diagnosis not present

## 2020-11-28 LAB — COMPREHENSIVE METABOLIC PANEL
ALT: 14 U/L (ref 0–35)
AST: 18 U/L (ref 0–37)
Albumin: 4.6 g/dL (ref 3.5–5.2)
Alkaline Phosphatase: 37 U/L — ABNORMAL LOW (ref 39–117)
BUN: 18 mg/dL (ref 6–23)
CO2: 28 mEq/L (ref 19–32)
Calcium: 9.7 mg/dL (ref 8.4–10.5)
Chloride: 103 mEq/L (ref 96–112)
Creatinine, Ser: 0.82 mg/dL (ref 0.40–1.20)
GFR: 86.8 mL/min (ref >60.00)
Glucose, Bld: 85 mg/dL (ref 70–99)
Potassium: 4.5 mEq/L (ref 3.5–5.1)
Sodium: 139 mEq/L (ref 135–145)
Total Bilirubin: 0.4 mg/dL (ref 0.2–1.2)
Total Protein: 7.1 g/dL (ref 6.0–8.3)

## 2020-11-28 LAB — LIPID PANEL
Cholesterol: 167 mg/dL (ref 0–200)
HDL: 59.5 mg/dL (ref >39.00)
LDL Cholesterol: 96 mg/dL (ref 0–99)
NonHDL: 107.8
Total CHOL/HDL Ratio: 3
Triglycerides: 60 mg/dL (ref 0.0–149.0)
VLDL: 12 mg/dL (ref 0.0–40.0)

## 2020-11-28 LAB — CBC WITH DIFFERENTIAL/PLATELET
Basophils Absolute: 0.1 10*3/uL (ref 0.0–0.1)
Basophils Relative: 1 % (ref 0.0–3.0)
Eosinophils Absolute: 0.1 10*3/uL (ref 0.0–0.7)
Eosinophils Relative: 1.3 % (ref 0.0–5.0)
HCT: 40.1 % (ref 36.0–46.0)
Hemoglobin: 13.4 g/dL (ref 12.0–15.0)
Lymphocytes Relative: 27.6 % (ref 12.0–46.0)
Lymphs Abs: 1.5 10*3/uL (ref 0.7–4.0)
MCHC: 33.5 g/dL (ref 30.0–36.0)
MCV: 90.4 fl (ref 78.0–100.0)
Monocytes Absolute: 0.3 10*3/uL (ref 0.1–1.0)
Monocytes Relative: 6 % (ref 3.0–12.0)
Neutro Abs: 3.5 10*3/uL (ref 1.4–7.7)
Neutrophils Relative %: 64.1 % (ref 43.0–77.0)
Platelets: 281 10*3/uL (ref 150.0–400.0)
RBC: 4.43 Mil/uL (ref 3.87–5.11)
RDW: 13.1 % (ref 11.5–15.5)
WBC: 5.5 10*3/uL (ref 4.0–10.5)

## 2020-11-28 LAB — TSH: TSH: 0.92 u[IU]/mL (ref 0.35–4.50)

## 2020-11-28 LAB — VITAMIN D 25 HYDROXY (VIT D DEFICIENCY, FRACTURES): VITD: 62.58 ng/mL (ref 30.00–100.00)

## 2020-11-28 LAB — MAGNESIUM: Magnesium: 2.3 mg/dL (ref 1.5–2.5)

## 2020-11-28 LAB — PHOSPHORUS: Phosphorus: 3.5 mg/dL (ref 2.3–4.6)

## 2020-11-28 MED ORDER — LINACLOTIDE 145 MCG PO CAPS: 145.0000 ug | ORAL_CAPSULE | Freq: Every day | ORAL | 3 refills | Status: DC

## 2020-11-28 MED ORDER — LEVOTHYROXINE SODIUM 75 MCG PO TABS: ORAL_TABLET | ORAL | 4 refills | Status: DC

## 2020-11-28 MED FILL — LINZESS 145 MCG CAPSULE: 145 | 30 days supply | Qty: 30 | Fill #0

## 2020-11-28 MED FILL — LEVOTHYROXINE 75 MCG TABLET: 75 | 90 days supply | Qty: 90 | Fill #0

## 2020-11-28 NOTE — Assessment & Plan Note (Signed)
chronic Follows with neurology

## 2020-11-28 NOTE — Assessment & Plan Note (Signed)
Chronic  Clinically euthyroid Currently taking levothyroxine 75 mcg daily Check tsh  Titrate med dose if needed  

## 2020-11-28 NOTE — Assessment & Plan Note (Signed)
Chronic Taking vitamin D daily Check vitamin D level  

## 2020-11-29 ENCOUNTER — Ambulatory Visit: Payer: 59 | Admitting: Neurology

## 2020-11-29 DIAGNOSIS — G43709 Chronic migraine without aura, not intractable, without status migrainosus: Secondary | ICD-10-CM

## 2020-11-29 NOTE — Progress Notes (Signed)
Botox- 200 units x 1 vial Lot: [id number] Expiration: 04/2023 NDC: 5465-6812-75  Bacteriostatic 0.9% Sodium Chloride- 71mL total Lot: [id number] Expiration: 01/17/2022 NDC: 4944-9675-91  Dx: M38.466 SAMPLES Billing by time

## 2020-11-29 NOTE — Progress Notes (Signed)
Consent Form Botulism Toxin Injection For Chronic Migraine  11/29/2020: Doing extremely well > 50% decrease in migraine freq. +a.  Reviewed orally with patient, additionally signature is on file:  I spent 30  minutes of face-to-face and non-face-to-face time with patient on the  1. Chronic migraine without aura without status migrainosus, not intractable    diagnosis.  This included previsit chart review, lab review, study review, order entry, electronic health record documentation, patient education on the different diagnostic and therapeutic options, counseling and coordination of care, risks and benefits of management, compliance, or risk factor reduction   Botulism toxin has been approved by the Federal drug administration for treatment of chronic migraine. Botulism toxin does not cure chronic migraine and it may not be effective in some patients.  The administration of botulism toxin is accomplished by injecting a small amount of toxin into the muscles of the neck and head. Dosage must be titrated for each individual. Any benefits resulting from botulism toxin tend to wear off after 3 months with a repeat injection required if benefit is to be maintained. Injections are usually done every 3-4 months with maximum effect peak achieved by about 2 or 3 weeks. Botulism toxin is expensive and you should be sure of what costs you will incur resulting from the injection.  The side effects of botulism toxin use for chronic migraine may include:   -Transient, and usually mild, facial weakness with facial injections  -Transient, and usually mild, head or neck weakness with head/neck injections  -Reduction or loss of forehead facial animation due to forehead muscle weakness  -Eyelid drooping  -Dry eye  -Pain at the site of injection or bruising at the site of injection  -Double vision  -Potential unknown long term risks  Contraindications: You should not have Botox if you are pregnant, nursing,  allergic to albumin, have an infection, skin condition, or muscle weakness at the site of the injection, or have myasthenia gravis, Lambert-Eaton syndrome, or ALS.  It is also possible that as with any injection, there may be an allergic reaction or no effect from the medication. Reduced effectiveness after repeated injections is sometimes seen and rarely infection at the injection site may occur. All care will be taken to prevent these side effects. If therapy is given over a long time, atrophy and wasting in the muscle injected may occur. Occasionally the patient's become refractory to treatment because they develop antibodies to the toxin. In this event, therapy needs to be modified.  I have read the above information and consent to the administration of botulism toxin.    BOTOX PROCEDURE NOTE FOR MIGRAINE HEADACHE    Contraindications and precautions discussed with patient(above). Aseptic procedure was observed and patient tolerated procedure. Procedure performed by Dr. Georgia Dom  The condition has existed for more than 6 months, and pt does not have a diagnosis of ALS, Myasthenia Gravis or Lambert-Eaton Syndrome.  Risks and benefits of injections discussed and pt agrees to proceed with the procedure.  Written consent obtained  These injections are medically necessary. Pt  receives good benefits from these injections. These injections do not cause sedations or hallucinations which the oral therapies may cause.  Description of procedure:  The patient was placed in a sitting position. The standard protocol was used for Botox as follows, with 5 units of Botox injected at each site:   -Procerus muscle, midline injection  -Corrugator muscle, bilateral injection  -Frontalis muscle, bilateral injection, with 2 sites each side, medial injection  was performed in the upper one third of the frontalis muscle, in the region vertical from the medial inferior edge of the superior orbital rim. The  lateral injection was again in the upper one third of the forehead vertically above the lateral limbus of the cornea, 1.5 cm lateral to the medial injection site.  -Temporalis muscle injection, 4 sites, bilaterally. The first injection was 3 cm above the tragus of the ear, second injection site was 1.5 cm to 3 cm up from the first injection site in line with the tragus of the ear. The third injection site was 1.5-3 cm forward between the first 2 injection sites. The fourth injection site was 1.5 cm posterior to the second injection site.   -Occipitalis muscle injection, 3 sites, bilaterally. The first injection was done one half way between the occipital protuberance and the tip of the mastoid process behind the ear. The second injection site was done lateral and superior to the first, 1 fingerbreadth from the first injection. The third injection site was 1 fingerbreadth superiorly and medially from the first injection site.  -Cervical paraspinal muscle injection, 2 sites, bilateral knee first injection site was 1 cm from the midline of the cervical spine, 3 cm inferior to the lower border of the occipital protuberance. The second injection site was 1.5 cm superiorly and laterally to the first injection site.  -Trapezius muscle injection was performed at 3 sites, bilaterally. The first injection site was in the upper trapezius muscle halfway between the inflection point of the neck, and the acromion. The second injection site was one half way between the acromion and the first injection site. The third injection was done between the first injection site and the inflection point of the neck.   Will return for repeat injection in 3 months.   200 units of Botox was used, any Botox not injected was wasted. The patient tolerated the procedure well, there were no complications of the above procedure.

## 2020-12-06 ENCOUNTER — Encounter: Payer: Self-pay | Admitting: Internal Medicine

## 2020-12-06 ENCOUNTER — Other Ambulatory Visit: Payer: Self-pay

## 2020-12-06 ENCOUNTER — Ambulatory Visit
Admission: RE | Admit: 2020-12-06 | Discharge: 2020-12-06 | Disposition: A | Discharge status: Home / Self Care | Payer: 59 | Source: Ambulatory Visit | Attending: Internal Medicine | Admitting: Internal Medicine

## 2020-12-06 DIAGNOSIS — Z9189 Other specified personal risk factors, not elsewhere classified: Secondary | ICD-10-CM | POA: Insufficient documentation

## 2020-12-06 DIAGNOSIS — Z13820 Encounter for screening for osteoporosis: Secondary | ICD-10-CM

## 2020-12-28 DIAGNOSIS — Z1159 Encounter for screening for other viral diseases: Secondary | ICD-10-CM | POA: Diagnosis not present

## 2021-02-08 ENCOUNTER — Ambulatory Visit: Payer: Self-pay | Admitting: Neurology

## 2021-03-10 ENCOUNTER — Other Ambulatory Visit (HOSPITAL_COMMUNITY): Payer: Self-pay | Admitting: Emergency Medicine

## 2021-03-16 ENCOUNTER — Other Ambulatory Visit (HOSPITAL_COMMUNITY): Payer: 59

## 2021-03-16 ENCOUNTER — Other Ambulatory Visit: Payer: Self-pay

## 2021-03-16 ENCOUNTER — Ambulatory Visit (HOSPITAL_BASED_OUTPATIENT_CLINIC_OR_DEPARTMENT_OTHER)
Admission: RE | Admit: 2021-03-16 | Discharge: 2021-03-16 | Disposition: A | Discharge status: Home / Self Care | Payer: 59 | Source: Ambulatory Visit | Attending: Cardiology | Admitting: Cardiology

## 2021-03-22 ENCOUNTER — Other Ambulatory Visit (HOSPITAL_COMMUNITY): Payer: Self-pay

## 2021-03-22 MED FILL — Levothyroxine Sodium Tab 75 MCG: ORAL | 90 days supply | Qty: 90 | Fill #0 | Status: AC

## 2021-05-09 ENCOUNTER — Ambulatory Visit: Payer: 59 | Admitting: Neurology

## 2021-05-09 DIAGNOSIS — G43709 Chronic migraine without aura, not intractable, without status migrainosus: Secondary | ICD-10-CM | POA: Diagnosis not present

## 2021-05-09 NOTE — Progress Notes (Signed)
Botox- 200 units x 1 vial Lot: [id number] Expiration: 12/2023 NDC: 0023-3921-02  Bacteriostatic 0.9% Sodium Chloride- 4mL total Lot: [id number] Expiration: 05/17/2022 NDC: 0409-1966-02  Dx: G43.709 B/B  

## 2021-05-09 NOTE — Progress Notes (Signed)
Consent Form Botulism Toxin Injection For Chronic Migraine  05/09/2021: Doing extremely well > 80% decrease in migraine freq. +a. DID some extra high behind the hairline  Also on the scalp and +temporalis.   Reviewed orally with patient, additionally signature is on file:  Botulism toxin has been approved by the Federal drug administration for treatment of chronic migraine. Botulism toxin does not cure chronic migraine and it may not be effective in some patients.  The administration of botulism toxin is accomplished by injecting a small amount of toxin into the muscles of the neck and head. Dosage must be titrated for each individual. Any benefits resulting from botulism toxin tend to wear off after 3 months with a repeat injection required if benefit is to be maintained. Injections are usually done every 3-4 months with maximum effect peak achieved by about 2 or 3 weeks. Botulism toxin is expensive and you should be sure of what costs you will incur resulting from the injection.  The side effects of botulism toxin use for chronic migraine may include:   -Transient, and usually mild, facial weakness with facial injections  -Transient, and usually mild, head or neck weakness with head/neck injections  -Reduction or loss of forehead facial animation due to forehead muscle weakness  -Eyelid drooping  -Dry eye  -Pain at the site of injection or bruising at the site of injection  -Double vision  -Potential unknown long term risks  Contraindications: You should not have Botox if you are pregnant, nursing, allergic to albumin, have an infection, skin condition, or muscle weakness at the site of the injection, or have myasthenia gravis, Lambert-Eaton syndrome, or ALS.  It is also possible that as with any injection, there may be an allergic reaction or no effect from the medication. Reduced effectiveness after repeated injections is sometimes seen and rarely infection at the injection site may occur.  All care will be taken to prevent these side effects. If therapy is given over a long time, atrophy and wasting in the muscle injected may occur. Occasionally the patient's become refractory to treatment because they develop antibodies to the toxin. In this event, therapy needs to be modified.  I have read the above information and consent to the administration of botulism toxin.    BOTOX PROCEDURE NOTE FOR MIGRAINE HEADACHE    Contraindications and precautions discussed with patient(above). Aseptic procedure was observed and patient tolerated procedure. Procedure performed by Dr. Georgia Dom  The condition has existed for more than 6 months, and pt does not have a diagnosis of ALS, Myasthenia Gravis or Lambert-Eaton Syndrome.  Risks and benefits of injections discussed and pt agrees to proceed with the procedure.  Written consent obtained  These injections are medically necessary. Pt  receives good benefits from these injections. These injections do not cause sedations or hallucinations which the oral therapies may cause.  Description of procedure:  The patient was placed in a sitting position. The standard protocol was used for Botox as follows, with 5 units of Botox injected at each site:   -Procerus muscle, midline injection  -Corrugator muscle, bilateral injection  -Frontalis muscle, bilateral injection, with 2 sites each side, medial injection was performed in the upper one third of the frontalis muscle, in the region vertical from the medial inferior edge of the superior orbital rim. The lateral injection was again in the upper one third of the forehead vertically above the lateral limbus of the cornea, 1.5 cm lateral to the medial injection site.  -Temporalis  muscle injection, 4 sites, bilaterally. The first injection was 3 cm above the tragus of the ear, second injection site was 1.5 cm to 3 cm up from the first injection site in line with the tragus of the ear. The third injection  site was 1.5-3 cm forward between the first 2 injection sites. The fourth injection site was 1.5 cm posterior to the second injection site.   -Occipitalis muscle injection, 3 sites, bilaterally. The first injection was done one half way between the occipital protuberance and the tip of the mastoid process behind the ear. The second injection site was done lateral and superior to the first, 1 fingerbreadth from the first injection. The third injection site was 1 fingerbreadth superiorly and medially from the first injection site.  -Cervical paraspinal muscle injection, 2 sites, bilateral knee first injection site was 1 cm from the midline of the cervical spine, 3 cm inferior to the lower border of the occipital protuberance. The second injection site was 1.5 cm superiorly and laterally to the first injection site.  -Trapezius muscle injection was performed at 3 sites, bilaterally. The first injection site was in the upper trapezius muscle halfway between the inflection point of the neck, and the acromion. The second injection site was one half way between the acromion and the first injection site. The third injection was done between the first injection site and the inflection point of the neck.   Will return for repeat injection in 3 months.   200 units of Botox was used, any Botox not injected was wasted. The patient tolerated the procedure well, there were no complications of the above procedure.

## 2021-06-09 ENCOUNTER — Other Ambulatory Visit: Payer: Self-pay | Admitting: Obstetrics

## 2021-06-09 DIAGNOSIS — Z1231 Encounter for screening mammogram for malignant neoplasm of breast: Secondary | ICD-10-CM

## 2021-06-21 ENCOUNTER — Other Ambulatory Visit (HOSPITAL_COMMUNITY): Payer: Self-pay

## 2021-06-21 MED FILL — Levothyroxine Sodium Tab 75 MCG: ORAL | 90 days supply | Qty: 90 | Fill #1 | Status: AC

## 2021-08-01 DIAGNOSIS — Z1211 Encounter for screening for malignant neoplasm of colon: Secondary | ICD-10-CM | POA: Diagnosis not present

## 2021-08-01 DIAGNOSIS — R1032 Left lower quadrant pain: Secondary | ICD-10-CM | POA: Diagnosis not present

## 2021-08-01 DIAGNOSIS — R140 Abdominal distension (gaseous): Secondary | ICD-10-CM | POA: Diagnosis not present

## 2021-08-01 DIAGNOSIS — K5904 Chronic idiopathic constipation: Secondary | ICD-10-CM | POA: Diagnosis not present

## 2021-08-07 ENCOUNTER — Other Ambulatory Visit (HOSPITAL_COMMUNITY): Payer: Self-pay

## 2021-08-07 DIAGNOSIS — Z1211 Encounter for screening for malignant neoplasm of colon: Secondary | ICD-10-CM | POA: Diagnosis not present

## 2021-08-07 DIAGNOSIS — K5904 Chronic idiopathic constipation: Secondary | ICD-10-CM | POA: Diagnosis not present

## 2021-08-07 DIAGNOSIS — K5140 Inflammatory polyps of colon without complications: Secondary | ICD-10-CM | POA: Diagnosis not present

## 2021-08-07 DIAGNOSIS — K5900 Constipation, unspecified: Secondary | ICD-10-CM | POA: Diagnosis not present

## 2021-08-07 DIAGNOSIS — D120 Benign neoplasm of cecum: Secondary | ICD-10-CM | POA: Diagnosis not present

## 2021-08-07 DIAGNOSIS — K635 Polyp of colon: Secondary | ICD-10-CM | POA: Diagnosis not present

## 2021-08-07 LAB — HM COLONOSCOPY

## 2021-08-07 MED ORDER — LINACLOTIDE 290 MCG PO CAPS
290.0000 ug | ORAL_CAPSULE | Freq: Every day | ORAL | 4 refills | Status: DC
  Filled 2021-08-07: qty 90, 90d supply, fill #0

## 2021-08-08 ENCOUNTER — Ambulatory Visit: Payer: 59 | Admitting: Neurology

## 2021-08-08 ENCOUNTER — Telehealth: Payer: Self-pay | Admitting: Neurology

## 2021-08-08 ENCOUNTER — Other Ambulatory Visit: Payer: Self-pay

## 2021-08-08 DIAGNOSIS — G43709 Chronic migraine without aura, not intractable, without status migrainosus: Secondary | ICD-10-CM

## 2021-08-08 NOTE — Telephone Encounter (Signed)
Patient has a Botox appointment today (we will use samples). She has [employer], now requiring PA for Botox. I spoke with [place] @ [employer] [phone]) to create a case. [facility] states PA for [id number]/G43.709 is pending for clinical notes. I faxed notes to [phone]. Reference #[id number].

## 2021-08-08 NOTE — Progress Notes (Signed)
I spent 30 minutes of face-to-face and non-face-to-face time with patient on the  1. Chronic migraine without aura without status migrainosus, not intractable    diagnosis.  This included previsit chart review, lab review, study review, order entry, electronic health record documentation, patient education on the different diagnostic and therapeutic options, counseling and coordination of care, risks and benefits of management, compliance, or risk factor reduction     Consent Form Botulism Toxin Injection For Chronic Migraine    Reviewed orally with patient, additionally signature is on file:  Botulism toxin has been approved by the Federal drug administration for treatment of chronic migraine. Botulism toxin does not cure chronic migraine and it may not be effective in some patients.  The administration of botulism toxin is accomplished by injecting a small amount of toxin into the muscles of the neck and head. Dosage must be titrated for each individual. Any benefits resulting from botulism toxin tend to wear off after 3 months with a repeat injection required if benefit is to be maintained. Injections are usually done every 3-4 months with maximum effect peak achieved by about 2 or 3 weeks. Botulism toxin is expensive and you should be sure of what costs you will incur resulting from the injection.  The side effects of botulism toxin use for chronic migraine may include:   -Transient, and usually mild, facial weakness with facial injections  -Transient, and usually mild, head or neck weakness with head/neck injections  -Reduction or loss of forehead facial animation due to forehead muscle weakness  -Eyelid drooping  -Dry eye  -Pain at the site of injection or bruising at the site of injection  -Double vision  -Potential unknown long term risks  Contraindications: You should not have Botox if you are pregnant, nursing, allergic to albumin, have an infection, skin condition, or muscle  weakness at the site of the injection, or have myasthenia gravis, Lambert-Eaton syndrome, or ALS.  It is also possible that as with any injection, there may be an allergic reaction or no effect from the medication. Reduced effectiveness after repeated injections is sometimes seen and rarely infection at the injection site may occur. All care will be taken to prevent these side effects. If therapy is given over a long time, atrophy and wasting in the muscle injected may occur. Occasionally the patient's become refractory to treatment because they develop antibodies to the toxin. In this event, therapy needs to be modified.  I have read the above information and consent to the administration of botulism toxin.    BOTOX PROCEDURE NOTE FOR MIGRAINE HEADACHE    Contraindications and precautions discussed with patient(above). Aseptic procedure was observed and patient tolerated procedure. Procedure performed by Dr. Georgia Dom  The condition has existed for more than 6 months, and pt does not have a diagnosis of ALS, Myasthenia Gravis or Lambert-Eaton Syndrome.  Risks and benefits of injections discussed and pt agrees to proceed with the procedure.  Written consent obtained  These injections are medically necessary. Pt  receives good benefits from these injections. These injections do not cause sedations or hallucinations which the oral therapies may cause.  Description of procedure:  The patient was placed in a sitting position. The standard protocol was used for Botox as follows, with 5 units of Botox injected at each site:   -Procerus muscle, midline injection  -Corrugator muscle, bilateral injection  -Frontalis muscle, bilateral injection, with 2 sites each side, medial injection was performed in the upper one third of the  frontalis muscle, in the region vertical from the medial inferior edge of the superior orbital rim. The lateral injection was again in the upper one third of the forehead  vertically above the lateral limbus of the cornea, 1.5 cm lateral to the medial injection site.  -Temporalis muscle injection, 4 sites, bilaterally. The first injection was 3 cm above the tragus of the ear, second injection site was 1.5 cm to 3 cm up from the first injection site in line with the tragus of the ear. The third injection site was 1.5-3 cm forward between the first 2 injection sites. The fourth injection site was 1.5 cm posterior to the second injection site.   -Occipitalis muscle injection, 3 sites, bilaterally. The first injection was done one half way between the occipital protuberance and the tip of the mastoid process behind the ear. The second injection site was done lateral and superior to the first, 1 fingerbreadth from the first injection. The third injection site was 1 fingerbreadth superiorly and medially from the first injection site.  -Cervical paraspinal muscle injection, 2 sites, bilateral knee first injection site was 1 cm from the midline of the cervical spine, 3 cm inferior to the lower border of the occipital protuberance. The second injection site was 1.5 cm superiorly and laterally to the first injection site.  -Trapezius muscle injection was performed at 3 sites, bilaterally. The first injection site was in the upper trapezius muscle halfway between the inflection point of the neck, and the acromion. The second injection site was one half way between the acromion and the first injection site. The third injection was done between the first injection site and the inflection point of the neck.   Will return for repeat injection in 3 months.   200 units of Botox was used, any Botox not injected was wasted. The patient tolerated the procedure well, there were no complications of the above procedure.

## 2021-08-08 NOTE — Progress Notes (Signed)
Botox- 100 units x 2 vials Lot: [id number] Expiration: 07/2021 NDC: [id number]  Bacteriostatic 0.9% Sodium Chloride- 9m total Lot: [id number]: 12/17/2022 NDC: [id number] Dx: [id number]

## 2021-08-15 ENCOUNTER — Ambulatory Visit: Payer: Self-pay | Admitting: Neurology

## 2021-08-16 NOTE — Telephone Encounter (Signed)
Received message from [facility] that request for Botox was denied. We used samples at last appointment, so this request was for future injection (next appt in November).   Denied d/t notes submitted did not state that patient has had at least a 2 month trial of 2 medications from the following classes- anti-seizure medication (patient has taken Topamax in the past), beta blocker (unsure if patient has taken), or antidepressant (amitriptyline or nortriptyline).   I was advised that we have 21 days to complete a p2p. Case reference #[id number]. If you want to move forward with p2p, you can call [facility] @ [phone]. Let me know if I can do anything to help.

## 2021-08-17 NOTE — Telephone Encounter (Signed)
I reached out to [facility] to set it up. They are supposed to call back.

## 2021-08-22 NOTE — Telephone Encounter (Signed)
Received approval from [facility]. PA #[id number] (08/08/21- 02/08/21).

## 2021-08-24 ENCOUNTER — Encounter: Payer: Self-pay | Admitting: Internal Medicine

## 2021-08-24 NOTE — Progress Notes (Signed)
Outside notes received. Information abstracted. Notes sent to scan.  

## 2021-08-30 DIAGNOSIS — H524 Presbyopia: Secondary | ICD-10-CM | POA: Diagnosis not present

## 2021-08-30 DIAGNOSIS — H25042 Posterior subcapsular polar age-related cataract, left eye: Secondary | ICD-10-CM | POA: Diagnosis not present

## 2021-09-15 ENCOUNTER — Other Ambulatory Visit (HOSPITAL_COMMUNITY): Payer: Self-pay

## 2021-09-15 MED FILL — Levothyroxine Sodium Tab 75 MCG: ORAL | 90 days supply | Qty: 90 | Fill #2 | Status: AC

## 2021-09-19 ENCOUNTER — Ambulatory Visit: Payer: Self-pay | Admitting: Neurology

## 2021-09-21 ENCOUNTER — Ambulatory Visit
Admission: RE | Admit: 2021-09-21 | Discharge: 2021-09-21 | Disposition: A | Discharge status: Home / Self Care | Payer: 59 | Source: Ambulatory Visit | Attending: Obstetrics | Admitting: Obstetrics

## 2021-09-21 ENCOUNTER — Other Ambulatory Visit: Payer: Self-pay

## 2021-09-21 DIAGNOSIS — Z1231 Encounter for screening mammogram for malignant neoplasm of breast: Secondary | ICD-10-CM | POA: Diagnosis not present

## 2021-09-25 ENCOUNTER — Other Ambulatory Visit: Payer: Self-pay | Admitting: Obstetrics

## 2021-09-25 DIAGNOSIS — R928 Other abnormal and inconclusive findings on diagnostic imaging of breast: Secondary | ICD-10-CM

## 2021-09-26 ENCOUNTER — Ambulatory Visit
Admission: RE | Admit: 2021-09-26 | Discharge: 2021-09-26 | Disposition: A | Discharge status: Home / Self Care | Payer: 59 | Source: Ambulatory Visit | Attending: Obstetrics | Admitting: Obstetrics

## 2021-09-26 ENCOUNTER — Other Ambulatory Visit: Payer: Self-pay

## 2021-09-26 ENCOUNTER — Other Ambulatory Visit: Payer: Self-pay | Admitting: Obstetrics

## 2021-09-26 DIAGNOSIS — N6321 Unspecified lump in the left breast, upper outer quadrant: Secondary | ICD-10-CM | POA: Diagnosis not present

## 2021-09-26 DIAGNOSIS — R922 Inconclusive mammogram: Secondary | ICD-10-CM | POA: Diagnosis not present

## 2021-09-26 DIAGNOSIS — R928 Other abnormal and inconclusive findings on diagnostic imaging of breast: Secondary | ICD-10-CM

## 2021-09-26 DIAGNOSIS — N6320 Unspecified lump in the left breast, unspecified quadrant: Secondary | ICD-10-CM

## 2021-10-12 ENCOUNTER — Other Ambulatory Visit: Payer: 59

## 2021-11-08 ENCOUNTER — Ambulatory Visit: Payer: 59 | Admitting: Neurology

## 2021-11-23 ENCOUNTER — Other Ambulatory Visit: Payer: Self-pay

## 2021-11-23 ENCOUNTER — Ambulatory Visit: Payer: 59 | Admitting: Neurology

## 2021-11-23 DIAGNOSIS — G43709 Chronic migraine without aura, not intractable, without status migrainosus: Secondary | ICD-10-CM | POA: Diagnosis not present

## 2021-11-23 NOTE — Progress Notes (Signed)
Consent Form Botulism Toxin Injection For Chronic Migraine  >80% improvement in migraine frequency and severity.  Reviewed orally with patient, additionally signature is on file:  Botulism toxin has been approved by the Federal drug administration for treatment of chronic migraine. Botulism toxin does not cure chronic migraine and it may not be effective in some patients.  The administration of botulism toxin is accomplished by injecting a small amount of toxin into the muscles of the neck and head. Dosage must be titrated for each individual. Any benefits resulting from botulism toxin tend to wear off after 3 months with a repeat injection required if benefit is to be maintained. Injections are usually done every 3-4 months with maximum effect peak achieved by about 2 or 3 weeks. Botulism toxin is expensive and you should be sure of what costs you will incur resulting from the injection.  The side effects of botulism toxin use for chronic migraine may include:   -Transient, and usually mild, facial weakness with facial injections  -Transient, and usually mild, head or neck weakness with head/neck injections  -Reduction or loss of forehead facial animation due to forehead muscle weakness  -Eyelid drooping  -Dry eye  -Pain at the site of injection or bruising at the site of injection  -Double vision  -Potential unknown long term risks  Contraindications: You should not have Botox if you are pregnant, nursing, allergic to albumin, have an infection, skin condition, or muscle weakness at the site of the injection, or have myasthenia gravis, Lambert-Eaton syndrome, or ALS.  It is also possible that as with any injection, there may be an allergic reaction or no effect from the medication. Reduced effectiveness after repeated injections is sometimes seen and rarely infection at the injection site may occur. All care will be taken to prevent these side effects. If therapy is given over a long  time, atrophy and wasting in the muscle injected may occur. Occasionally the patient's become refractory to treatment because they develop antibodies to the toxin. In this event, therapy needs to be modified.  I have read the above information and consent to the administration of botulism toxin.    BOTOX PROCEDURE NOTE FOR MIGRAINE HEADACHE    Contraindications and precautions discussed with patient(above). Aseptic procedure was observed and patient tolerated procedure. Procedure performed by Dr. Georgia Dom  The condition has existed for more than 6 months, and pt does not have a diagnosis of ALS, Myasthenia Gravis or Lambert-Eaton Syndrome.  Risks and benefits of injections discussed and pt agrees to proceed with the procedure.  Written consent obtained  These injections are medically necessary. Pt  receives good benefits from these injections. These injections do not cause sedations or hallucinations which the oral therapies may cause.  Description of procedure:  The patient was placed in a sitting position. The standard protocol was used for Botox as follows, with 5 units of Botox injected at each site:   -Procerus muscle, midline injection  -Corrugator muscle, bilateral injection  -Frontalis muscle, bilateral injection, with 2 sites each side, medial injection was performed in the upper one third of the frontalis muscle, in the region vertical from the medial inferior edge of the superior orbital rim. The lateral injection was again in the upper one third of the forehead vertically above the lateral limbus of the cornea, 1.5 cm lateral to the medial injection site.  -Temporalis muscle injection, 4 sites, bilaterally. The first injection was 3 cm above the tragus of the ear, second injection  site was 1.5 cm to 3 cm up from the first injection site in line with the tragus of the ear. The third injection site was 1.5-3 cm forward between the first 2 injection sites. The fourth injection  site was 1.5 cm posterior to the second injection site.   -Occipitalis muscle injection, 3 sites, bilaterally. The first injection was done one half way between the occipital protuberance and the tip of the mastoid process behind the ear. The second injection site was done lateral and superior to the first, 1 fingerbreadth from the first injection. The third injection site was 1 fingerbreadth superiorly and medially from the first injection site.  -Cervical paraspinal muscle injection, 2 sites, bilateral knee first injection site was 1 cm from the midline of the cervical spine, 3 cm inferior to the lower border of the occipital protuberance. The second injection site was 1.5 cm superiorly and laterally to the first injection site.  -Trapezius muscle injection was performed at 3 sites, bilaterally. The first injection site was in the upper trapezius muscle halfway between the inflection point of the neck, and the acromion. The second injection site was one half way between the acromion and the first injection site. The third injection was done between the first injection site and the inflection point of the neck.   Will return for repeat injection in 3 months.   155 units of Botox was used, 45 Botox not injected was wasted. The patient tolerated the procedure well, there were no complications of the above procedure.

## 2021-11-23 NOTE — Progress Notes (Signed)
Botox- 200 units x 1 vial Lot: [id number] Expiration: 01/2024 NDC: [id number]  Bacteriostatic 0.9% Sodium Chloride- 18mL total Lot: [id number] Expiration: 12/17/2022 NDC: 9483-4758-30  Dx: X46.002 B/B

## 2021-11-29 DIAGNOSIS — H25042 Posterior subcapsular polar age-related cataract, left eye: Secondary | ICD-10-CM | POA: Diagnosis not present

## 2021-12-04 ENCOUNTER — Encounter: Payer: Self-pay | Admitting: Internal Medicine

## 2021-12-04 ENCOUNTER — Other Ambulatory Visit: Payer: Self-pay | Admitting: Internal Medicine

## 2021-12-04 ENCOUNTER — Other Ambulatory Visit (HOSPITAL_COMMUNITY): Payer: Self-pay

## 2021-12-04 MED ORDER — LEVOTHYROXINE SODIUM 75 MCG PO TABS
75.0000 ug | ORAL_TABLET | Freq: Every day | ORAL | 4 refills | Status: DC
  Filled 2021-12-04: qty 90, 90d supply, fill #0

## 2021-12-05 ENCOUNTER — Other Ambulatory Visit (HOSPITAL_COMMUNITY): Payer: Self-pay

## 2021-12-05 MED ORDER — LINACLOTIDE 290 MCG PO CAPS
290.0000 ug | ORAL_CAPSULE | Freq: Every day | ORAL | 3 refills | Status: DC
  Filled 2021-12-05: qty 90, 90d supply, fill #0
  Filled 2022-05-07: qty 90, 90d supply, fill #1
  Filled 2022-08-13: qty 90, 90d supply, fill #2
  Filled 2022-11-23 (×2): qty 90, 90d supply, fill #3

## 2021-12-05 MED ORDER — LEVOTHYROXINE SODIUM 75 MCG PO TABS
75.0000 ug | ORAL_TABLET | Freq: Every day | ORAL | 3 refills | Status: DC
  Filled 2021-12-05 – 2022-03-12 (×2): qty 90, 90d supply, fill #0
  Filled 2022-05-07 – 2022-05-08 (×3): qty 90, 90d supply, fill #1
  Filled 2022-09-07: qty 90, 90d supply, fill #2
  Filled 2022-11-23 (×2): qty 90, 90d supply, fill #3

## 2021-12-13 IMAGING — MG DIGITAL SCREENING BILAT W/ TOMO W/ CAD
6 of 10 series · 6 of 30 positions shown · non-contrast
Comparison: Previous exam(s).

CLINICAL DATA: Screening.

EXAM:
DIGITAL SCREENING BILATERAL MAMMOGRAM WITH TOMO AND CAD

[L CC synth-2D]
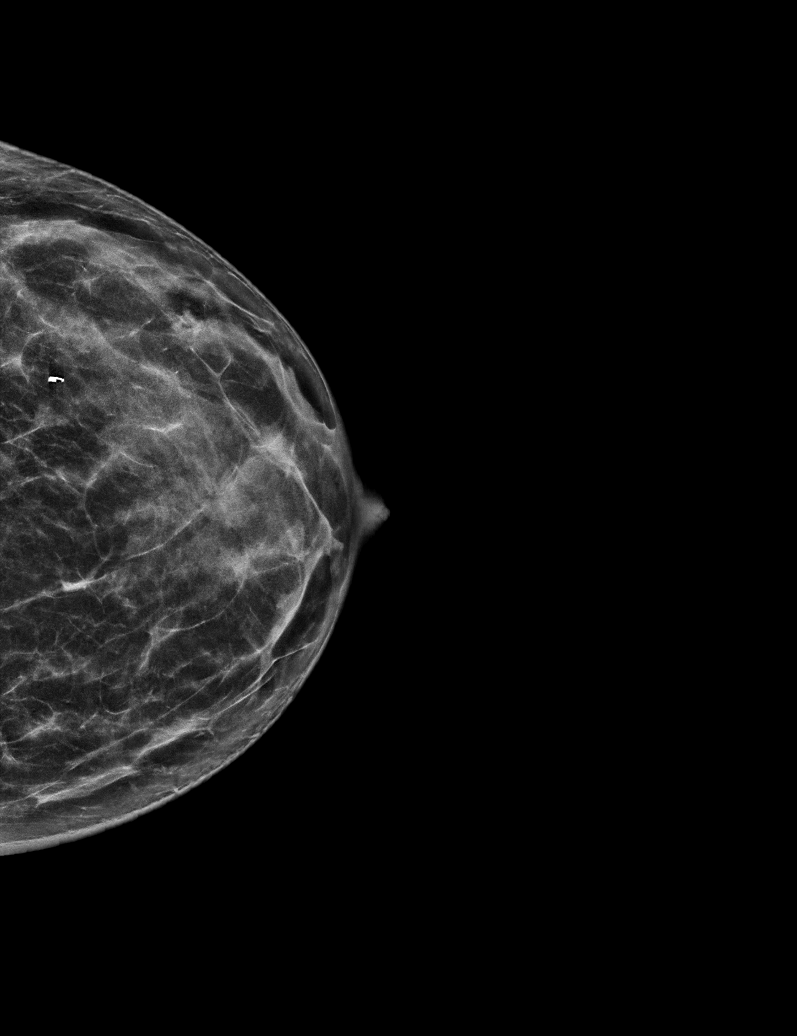

[R MLO synth-2D (1 of 2)]
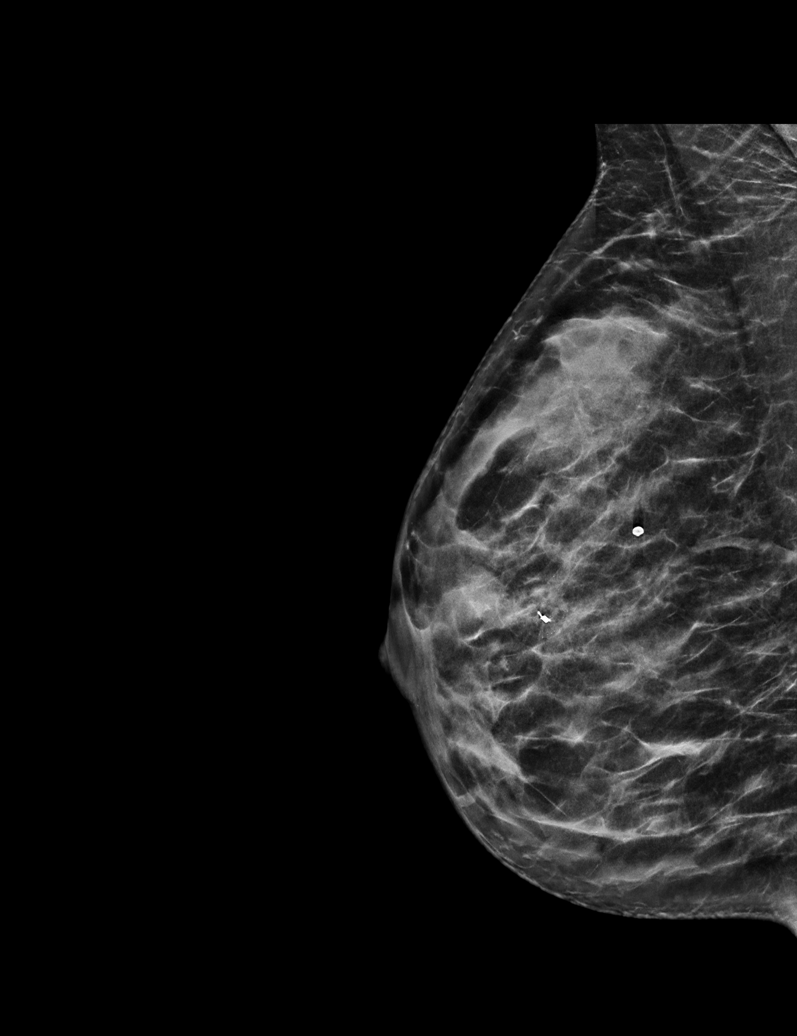

[R MLO synth-2D (2 of 2)]
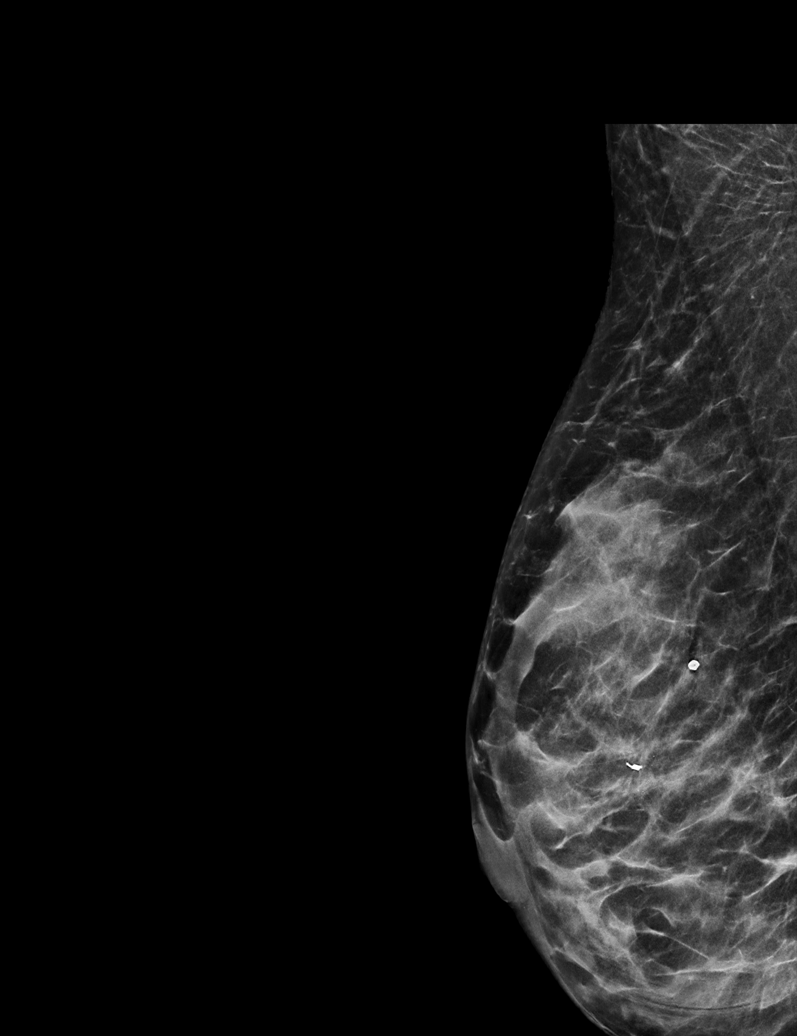

[R CC synth-2D]
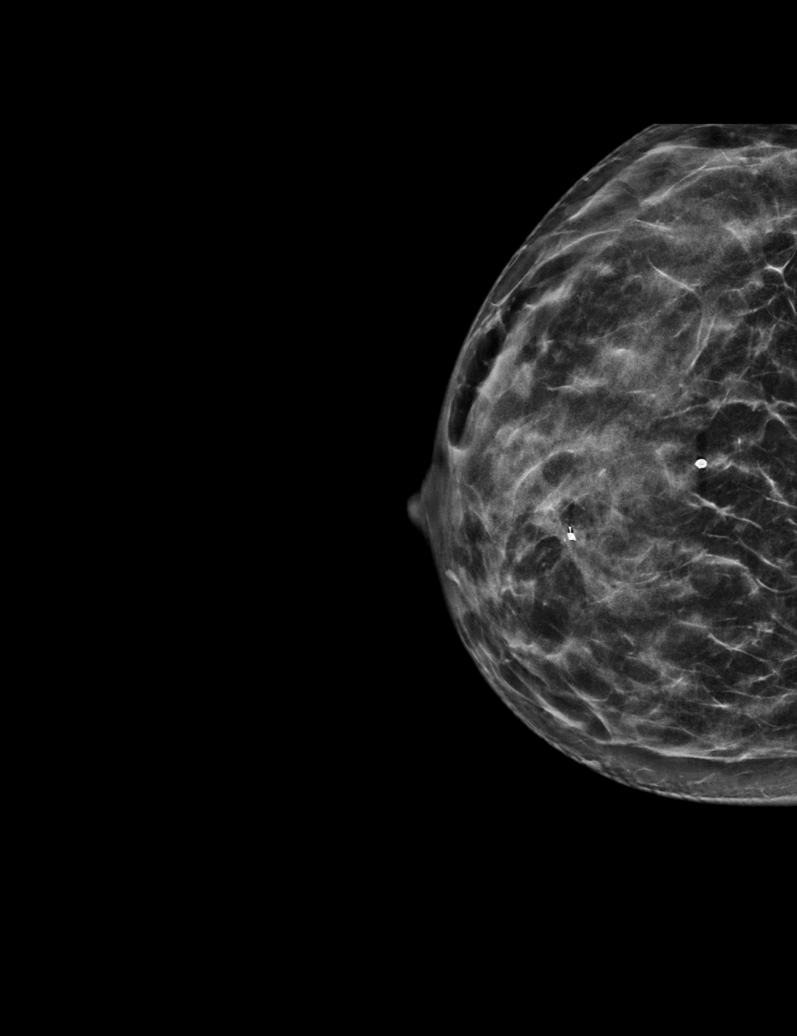

[L MLO synth-2D]
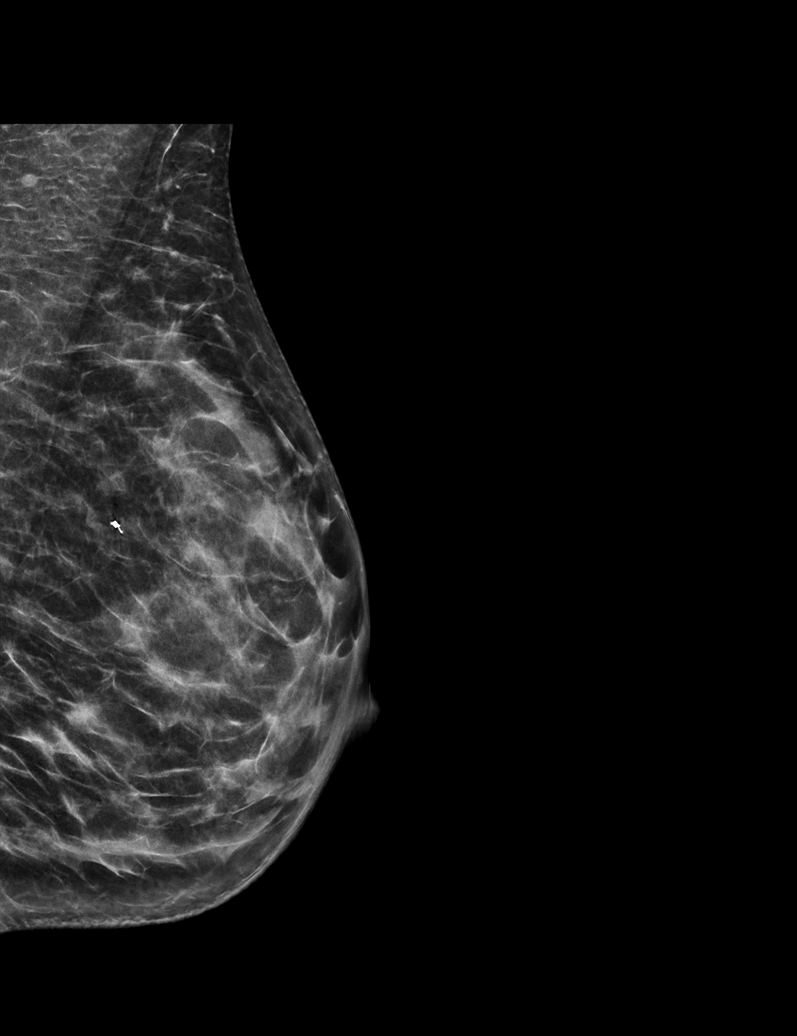

[L CC tomo · tomo slice 28/55.0]
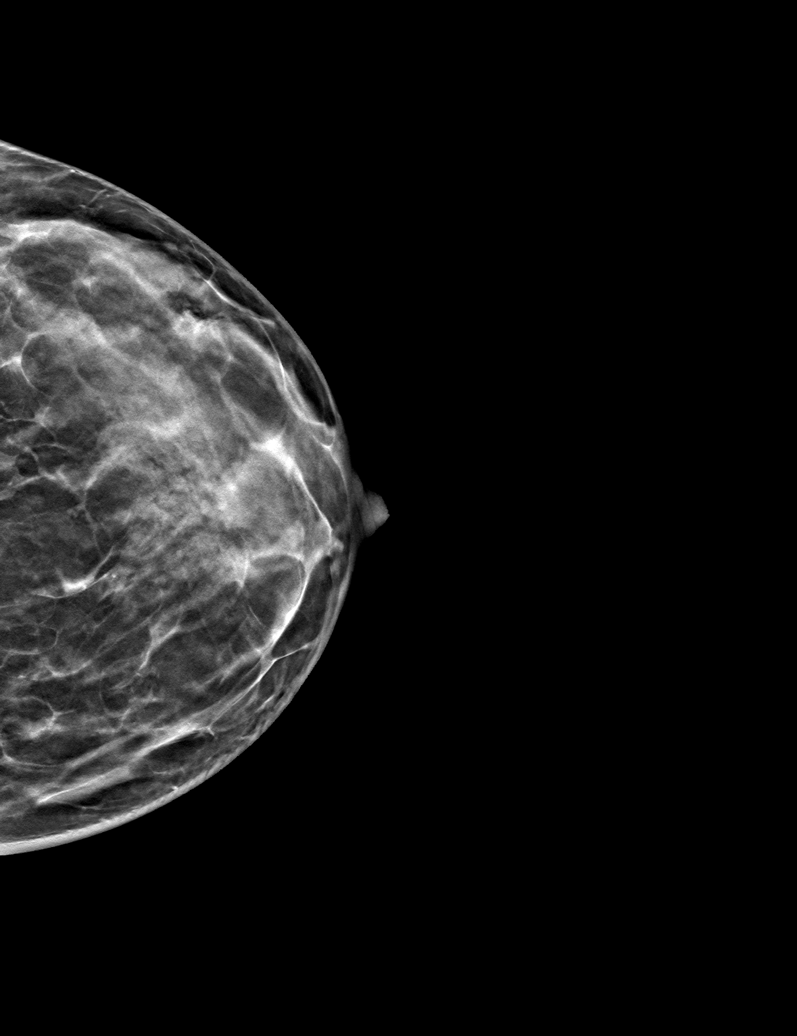

[6 of 30 positions shown; findings below may reference images not displayed]

ACR Breast Density Category c: The breast tissue is heterogeneously
dense, which may obscure small masses.
FINDINGS: There are no findings suspicious for malignancy. Images were
processed with CAD.
IMPRESSION: No mammographic evidence of malignancy. A result letter of this
screening mammogram will be mailed directly to the patient.

RECOMMENDATION:
Screening mammogram in one year. (Code:[id number])

BI-RADS CATEGORY  1: Negative.

## 2021-12-17 HISTORY — PX: OTHER SURGICAL HISTORY: SHX169

## 2021-12-25 ENCOUNTER — Encounter: Payer: Self-pay | Admitting: Internal Medicine

## 2021-12-25 NOTE — Progress Notes (Signed)
Subjective:    Patient ID: Cynthia Ramsey, female    DOB: [birth year], 46 y.o.   MRN: [id number]   This visit occurred during the SARS-CoV-2 public health emergency.  Safety protocols were in place, including screening questions prior to the visit, additional usage of staff PPE, and extensive cleaning of exam room while observing appropriate contact time as indicated for disinfecting solutions.    HPI She is here for a physical exam.   She has no concerns.  To have cataract surgery on left eye.  Still has some ankle pain with overuse.   Medications and allergies reviewed with patient and updated if appropriate.  Patient Active Problem List   Diagnosis Date Noted   At risk for decreased bone density 12/06/2020   Left lower quadrant abdominal pain 06/22/2019   Vitamin D deficiency 12/03/2017   Migraines 06/03/2017   Abnormal mammogram of both breasts 04/30/2016   Chronic constipation    Allergic rhinitis 02/14/2010   Hypothyroidism 02/07/2010    Current Outpatient Medications on File Prior to Visit  Medication Sig Dispense Refill   Cholecalciferol (VITAMIN D) 2000 UNITS tablet Take 1 tablet (2,000 Units total) by mouth daily. 30 tablet 11   fluticasone (FLONASE) 50 MCG/ACT nasal spray INHALE 1 SPRAY INTO EACH NOSTRILS DAILY. (Patient taking differently: daily as needed.) 16 g 11   levothyroxine (SYNTHROID) 75 MCG tablet Take 1 tablet (75 mcg total) by mouth daily before breakfast. 90 tablet 3   linaclotide (LINZESS) 290 MCG CAPS capsule Take 1 capsule (290 mcg total) by mouth 30 minutes before the first meal of the day. 90 capsule 3   loratadine (CLARITIN) 10 MG tablet Take 10 mg by mouth daily as needed.     Multiple Vitamin (MULTIVITAMIN) tablet Take 1 tablet by mouth daily.     rizatriptan (MAXALT-MLT) 10 MG disintegrating tablet Take 1 tablet (10 mg total) by mouth as needed for migraine. May repeat in 2 hours if needed 9 tablet 12   No current facility-administered  medications on file prior to visit.    Past Medical History:  Diagnosis Date   ALLERGIC RHINITIS    Headache(784.0)    HYPOTHYROIDISM    Hashimoto's history    Past Surgical History:  Procedure Laterality Date   BREAST BIOPSY Right 04/27/2016   benign   BREAST BIOPSY Left    benign   Child birth  06 & 09   x's 2    RHINOPLASTY      Social History   Socioeconomic History   Marital status: Married    Spouse name: Not on file   Number of children: 2   Years of education: Not on file   Highest education level: Not on file  Occupational History   Occupation: Physician  Tobacco Use   Smoking status: Never   Smokeless tobacco: Never   Tobacco comments:    Married, lives with spouse. employed as hospitalist for [employer]  Substance and Sexual Activity   Alcohol use: No   Drug use: No   Sexual activity: Not on file  Other Topics Concern   Not on file  Social History Narrative   Lives with husband and kids   Caffeine use: 1 cup coffee on work days   Tea twice daily   Social Determinants of Health   Financial Resource Strain: Not on file  Food Insecurity: Not on file  Transportation Needs: Not on file  Physical Activity: Not on file  Stress: Not on file  Social Connections: Not on file    Family History  Problem Relation Age of Onset   Hyperlipidemia Mother    Hypertension Mother    Heart disease Father    Atrial fibrillation Father    Migraines Neg Hx     Review of Systems  Constitutional:  Negative for chills and fever.  Eyes:  Positive for visual disturbance (cataract left eye).  Respiratory:  Negative for cough, shortness of breath and wheezing.   Cardiovascular:  Negative for chest pain, palpitations and leg swelling.  Gastrointestinal:  Positive for constipation. Negative for abdominal pain, blood in stool, diarrhea and nausea.  Genitourinary:  Negative for dysuria.  Musculoskeletal:  Positive for arthralgias (ankle).  Skin:  Negative for rash.   Neurological:  Positive for headaches. Negative for dizziness and light-headedness.  Psychiatric/Behavioral:  Negative for dysphoric mood. The patient is not nervous/anxious.       Objective:   Vitals:   12/26/21 1302  BP: 110/78  Pulse: 68  Temp: 98.3 F (36.8 C)  SpO2: 99%   Filed Weights   12/26/21 1302  Weight: 117 lb (53.1 kg)   Body mass index is 21.4 kg/m.  BP Readings from Last 3 Encounters:  12/26/21 110/78  11/28/20 106/62  06/22/19 102/62    Wt Readings from Last 3 Encounters:  12/26/21 117 lb (53.1 kg)  06/22/19 109 lb (49.4 kg)  12/02/18 112 lb (50.8 kg)    Depression screen [facility] 2/9 12/26/2021 11/28/2020  Decreased Interest 0 0  Down, Depressed, Hopeless 0 0  PHQ - 2 Score 0 0     No flowsheet data found.     Physical Exam Constitutional: She appears well-developed and well-nourished. No distress.  HENT:  Head: Normocephalic and atraumatic.  Right Ear: External ear normal. Normal ear canal and TM Left Ear: External ear normal.  Normal ear canal and TM Mouth/Throat: Oropharynx is clear and moist.  Eyes: Conjunctivae and EOM are normal.  Neck: Neck supple. No tracheal deviation present. No thyromegaly present.  No carotid bruit  Cardiovascular: Normal rate, regular rhythm and normal heart sounds.   No murmur heard.  No edema. Pulmonary/Chest: Effort normal and breath sounds normal. No respiratory distress. She has no wheezes. She has no rales.  Breast: deferred   Abdominal: Soft. She exhibits no distension. There is no tenderness.  Lymphadenopathy: She has no cervical adenopathy.  Skin: Skin is warm and dry. She is not diaphoretic.  Psychiatric: She has a normal mood and affect. Her behavior is normal.     Lab Results  Component Value Date   WBC 5.5 11/28/2020   HGB 13.4 11/28/2020   HCT 40.1 11/28/2020   PLT 281.0 11/28/2020   GLUCOSE 85 11/28/2020   CHOL 167 11/28/2020   TRIG 60.0 11/28/2020   HDL 59.50 11/28/2020   LDLCALC 96  11/28/2020   ALT 14 11/28/2020   AST 18 11/28/2020   NA 139 11/28/2020   K 4.5 11/28/2020   CL 103 11/28/2020   CREATININE 0.82 11/28/2020   BUN 18 11/28/2020   CO2 28 11/28/2020   TSH 0.92 11/28/2020   HGBA1C 5.3 04/03/2016         Assessment & Plan:   Physical exam: Screening blood work  ordered Exercise  regular Weight  normal Substance abuse  none   Reviewed recommended immunizations.  Tdap today   Health Maintenance  Topic Date Due   TETANUS/TDAP  05/20/2021   PAP SMEAR-Modifier  01/17/2022   COVID-19 Vaccine (4 -  Booster for [employer] series) 01/11/2022 (Originally 12/06/2020)   INFLUENZA VACCINE  03/16/2022 (Originally 07/17/2021)   HIV Screening  12/26/2022 (Originally 03/03/1991)   MAMMOGRAM  09/22/2023   COLONOSCOPY (Pts 45-28yrs Insurance coverage will need to be confirmed)  08/08/2031   Hepatitis C Screening  Completed   Pneumococcal Vaccine 9-21 Years old  Aged Out   HPV VACCINES  Aged Out          See Problem List for Assessment and Plan of chronic medical problems.

## 2021-12-25 NOTE — Patient Instructions (Addendum)
Blood work was ordered.  Have this done when you can.     Medications changes include :   none     Please followup in 1 year   Health Maintenance, Female Adopting a healthy lifestyle and getting preventive care are important in promoting health and wellness. Ask your health care provider about: The right schedule for you to have regular tests and exams. Things you can do on your own to prevent diseases and keep yourself healthy. What should I know about diet, weight, and exercise? Eat a healthy diet  Eat a diet that includes plenty of vegetables, fruits, low-fat dairy products, and lean protein. Do not eat a lot of foods that are high in solid fats, added sugars, or sodium. Maintain a healthy weight Body mass index (BMI) is used to identify weight problems. It estimates body fat based on height and weight. Your health care provider can help determine your BMI and help you achieve or maintain a healthy weight. Get regular exercise Get regular exercise. This is one of the most important things you can do for your health. Most adults should: Exercise for at least 150 minutes each week. The exercise should increase your heart rate and make you sweat (moderate-intensity exercise). Do strengthening exercises at least twice a week. This is in addition to the moderate-intensity exercise. Spend less time sitting. Even light physical activity can be beneficial. Watch cholesterol and blood lipids Have your blood tested for lipids and cholesterol at 46 years of age, then have this test every 5 years. Have your cholesterol levels checked more often if: Your lipid or cholesterol levels are high. You are older than 46 years of age. You are at high risk for heart disease. What should I know about cancer screening? Depending on your health history and family history, you may need to have cancer screening at various ages. This may include screening for: Breast cancer. Cervical cancer. Colorectal  cancer. Skin cancer. Lung cancer. What should I know about heart disease, diabetes, and high blood pressure? Blood pressure and heart disease High blood pressure causes heart disease and increases the risk of stroke. This is more likely to develop in people who have high blood pressure readings or are overweight. Have your blood pressure checked: Every 3-5 years if you are 17-43 years of age. Every year if you are 25 years old or older. Diabetes Have regular diabetes screenings. This checks your fasting blood sugar level. Have the screening done: Once every three years after age 42 if you are at a normal weight and have a low risk for diabetes. More often and at a younger age if you are overweight or have a high risk for diabetes. What should I know about preventing infection? Hepatitis B If you have a higher risk for hepatitis B, you should be screened for this virus. Talk with your health care provider to find out if you are at risk for hepatitis B infection. Hepatitis C Testing is recommended for: Everyone born from [birth year] through 1965. Anyone with known risk factors for hepatitis C. Sexually transmitted infections (STIs) Get screened for STIs, including gonorrhea and chlamydia, if: You are sexually active and are younger than 46 years of age. You are older than 46 years of age and your health care provider tells you that you are at risk for this type of infection. Your sexual activity has changed since you were last screened, and you are at increased risk for chlamydia or gonorrhea. Ask your health care  provider if you are at risk. Ask your health care provider about whether you are at high risk for HIV. Your health care provider may recommend a prescription medicine to help prevent HIV infection. If you choose to take medicine to prevent HIV, you should first get tested for HIV. You should then be tested every 3 months for as long as you are taking the medicine. Pregnancy If you are  about to stop having your period (premenopausal) and you may become pregnant, seek counseling before you get pregnant. Take 400 to 800 micrograms (mcg) of folic acid every day if you become pregnant. Ask for birth control (contraception) if you want to prevent pregnancy. Osteoporosis and menopause Osteoporosis is a disease in which the bones lose minerals and strength with aging. This can result in bone fractures. If you are 3 years old or older, or if you are at risk for osteoporosis and fractures, ask your health care provider if you should: Be screened for bone loss. Take a calcium or vitamin D supplement to lower your risk of fractures. Be given hormone replacement therapy (HRT) to treat symptoms of menopause. Follow these instructions at home: Alcohol use Do not drink alcohol if: Your health care provider tells you not to drink. You are pregnant, may be pregnant, or are planning to become pregnant. If you drink alcohol: Limit how much you have to: 0-1 drink a day. Know how much alcohol is in your drink. In the [place] one drink equals one 12 oz bottle of beer (355 mL), one 5 oz glass of wine (148 mL), or one 1 oz glass of hard liquor (44 mL). Lifestyle Do not use any products that contain nicotine or tobacco. These products include cigarettes, chewing tobacco, and vaping devices, such as e-cigarettes. If you need help quitting, ask your health care provider. Do not use street drugs. Do not share needles. Ask your health care provider for help if you need support or information about quitting drugs. General instructions Schedule regular health, dental, and eye exams. Stay current with your vaccines. Tell your health care provider if: You often feel depressed. You have ever been abused or do not feel safe at home. Summary Adopting a healthy lifestyle and getting preventive care are important in promoting health and wellness. Follow your health care provider's instructions about  healthy diet, exercising, and getting tested or screened for diseases. Follow your health care provider's instructions on monitoring your cholesterol and blood pressure. This information is not intended to replace advice given to you by your health care provider. Make sure you discuss any questions you have with your health care provider. Document Revised: 04/24/2021 Document Reviewed: 04/24/2021 Else[employer] Patient Education  [place].

## 2021-12-26 ENCOUNTER — Ambulatory Visit (INDEPENDENT_AMBULATORY_CARE_PROVIDER_SITE_OTHER): Payer: 59 | Admitting: Internal Medicine

## 2021-12-26 ENCOUNTER — Encounter: Payer: Self-pay | Admitting: Internal Medicine

## 2021-12-26 ENCOUNTER — Other Ambulatory Visit: Payer: Self-pay

## 2021-12-26 VITALS — BP 110/78 | HR 68 | Temp 98.3°F | Ht 62.0 in | Wt 117.0 lb

## 2021-12-26 DIAGNOSIS — Z23 Encounter for immunization: Secondary | ICD-10-CM | POA: Diagnosis not present

## 2021-12-26 DIAGNOSIS — Z0000 Encounter for general adult medical examination without abnormal findings: Secondary | ICD-10-CM

## 2021-12-26 DIAGNOSIS — E038 Other specified hypothyroidism: Secondary | ICD-10-CM | POA: Diagnosis not present

## 2021-12-26 DIAGNOSIS — K5909 Other constipation: Secondary | ICD-10-CM

## 2021-12-26 DIAGNOSIS — E559 Vitamin D deficiency, unspecified: Secondary | ICD-10-CM

## 2021-12-26 DIAGNOSIS — G43809 Other migraine, not intractable, without status migrainosus: Secondary | ICD-10-CM

## 2021-12-26 NOTE — Assessment & Plan Note (Signed)
Chronic Controlled Continue Linzess 290 mcg daily

## 2021-12-26 NOTE — Assessment & Plan Note (Signed)
Chronic  Clinically euthyroid Currently taking levothyroxine 75 mcg daily Check tsh  Titrate med dose if needed  

## 2021-12-26 NOTE — Assessment & Plan Note (Signed)
Chronic Management per neurology

## 2021-12-26 NOTE — Assessment & Plan Note (Signed)
Chronic Taking vitamin D daily Check vitamin D level  

## 2022-01-09 ENCOUNTER — Other Ambulatory Visit: Payer: Self-pay

## 2022-01-09 ENCOUNTER — Other Ambulatory Visit (INDEPENDENT_AMBULATORY_CARE_PROVIDER_SITE_OTHER): Payer: 59

## 2022-01-09 DIAGNOSIS — E559 Vitamin D deficiency, unspecified: Secondary | ICD-10-CM

## 2022-01-09 DIAGNOSIS — E038 Other specified hypothyroidism: Secondary | ICD-10-CM | POA: Diagnosis not present

## 2022-01-09 DIAGNOSIS — Z0000 Encounter for general adult medical examination without abnormal findings: Secondary | ICD-10-CM | POA: Diagnosis not present

## 2022-01-09 LAB — COMPREHENSIVE METABOLIC PANEL
ALT: 21 U/L (ref 0–35)
AST: 21 U/L (ref 0–37)
Albumin: 4 g/dL (ref 3.5–5.2)
Alkaline Phosphatase: 38 U/L — ABNORMAL LOW (ref 39–117)
BUN: 14 mg/dL (ref 6–23)
CO2: 27 mEq/L (ref 19–32)
Calcium: 8.9 mg/dL (ref 8.4–10.5)
Chloride: 108 mEq/L (ref 96–112)
Creatinine, Ser: 0.71 mg/dL (ref 0.40–1.20)
GFR: 102.37 mL/min (ref >60.00)
Glucose, Bld: 95 mg/dL (ref 70–99)
Potassium: 4.4 mEq/L (ref 3.5–5.1)
Sodium: 139 mEq/L (ref 135–145)
Total Bilirubin: 0.5 mg/dL (ref 0.2–1.2)
Total Protein: 6.5 g/dL (ref 6.0–8.3)

## 2022-01-09 LAB — CBC WITH DIFFERENTIAL/PLATELET
Basophils Absolute: 0 10*3/uL (ref 0.0–0.1)
Basophils Relative: 0.8 % (ref 0.0–3.0)
Eosinophils Absolute: 0.2 10*3/uL (ref 0.0–0.7)
Eosinophils Relative: 3.1 % (ref 0.0–5.0)
HCT: 38.9 % (ref 36.0–46.0)
Hemoglobin: 12.7 g/dL (ref 12.0–15.0)
Lymphocytes Relative: 27.4 % (ref 12.0–46.0)
Lymphs Abs: 1.4 10*3/uL (ref 0.7–4.0)
MCHC: 32.7 g/dL (ref 30.0–36.0)
MCV: 90.9 fl (ref 78.0–100.0)
Monocytes Absolute: 0.3 10*3/uL (ref 0.1–1.0)
Monocytes Relative: 6.6 % (ref 3.0–12.0)
Neutro Abs: 3.2 10*3/uL (ref 1.4–7.7)
Neutrophils Relative %: 62.1 % (ref 43.0–77.0)
Platelets: 228 10*3/uL (ref 150.0–400.0)
RBC: 4.27 Mil/uL (ref 3.87–5.11)
RDW: 13.1 % (ref 11.5–15.5)
WBC: 5.2 10*3/uL (ref 4.0–10.5)

## 2022-01-09 LAB — TSH: TSH: 1.41 u[IU]/mL (ref 0.35–5.50)

## 2022-01-09 LAB — LIPID PANEL
Cholesterol: 161 mg/dL (ref 0–200)
HDL: 56.2 mg/dL (ref >39.00)
LDL Cholesterol: 87 mg/dL (ref 0–99)
NonHDL: 105.17
Total CHOL/HDL Ratio: 3
Triglycerides: 89 mg/dL (ref 0.0–149.0)
VLDL: 17.8 mg/dL (ref 0.0–40.0)

## 2022-01-09 LAB — VITAMIN D 25 HYDROXY (VIT D DEFICIENCY, FRACTURES): VITD: 62.69 ng/mL (ref 30.00–100.00)

## 2022-01-11 DIAGNOSIS — H268 Other specified cataract: Secondary | ICD-10-CM | POA: Diagnosis not present

## 2022-01-11 DIAGNOSIS — H25042 Posterior subcapsular polar age-related cataract, left eye: Secondary | ICD-10-CM | POA: Diagnosis not present

## 2022-01-25 ENCOUNTER — Telehealth: Payer: Self-pay | Admitting: Neurology

## 2022-01-25 NOTE — Telephone Encounter (Signed)
Faxed updated OV notes to [facility] @ [phone] for approval.

## 2022-02-12 DIAGNOSIS — H25041 Posterior subcapsular polar age-related cataract, right eye: Secondary | ICD-10-CM | POA: Diagnosis not present

## 2022-02-12 DIAGNOSIS — Z961 Presence of intraocular lens: Secondary | ICD-10-CM | POA: Diagnosis not present

## 2022-02-12 DIAGNOSIS — H26492 Other secondary cataract, left eye: Secondary | ICD-10-CM | POA: Diagnosis not present

## 2022-02-15 ENCOUNTER — Ambulatory Visit: Payer: 59 | Admitting: Neurology

## 2022-02-16 ENCOUNTER — Ambulatory Visit: Payer: 59 | Admitting: Neurology

## 2022-02-16 ENCOUNTER — Other Ambulatory Visit: Payer: Self-pay

## 2022-02-16 VITALS — BP 95/66 | HR 71

## 2022-02-16 DIAGNOSIS — G43709 Chronic migraine without aura, not intractable, without status migrainosus: Secondary | ICD-10-CM

## 2022-02-16 NOTE — Progress Notes (Signed)
Botox- 200 units x 1 vial ?Lot: [id number] ?Expiration: 03/2024 ?[place]: [phone] ? ?Bacteriostatic 0.9% Sodium Chloride- 43mL total ?Lot: [id number] ?Expiration: 12/2022 ?[place]: 3646-8032-12 ? ?Dx: Y48.250 ?BB ?

## 2022-02-18 NOTE — Progress Notes (Signed)
?Consent Form ?Botulism Toxin Injection For Chronic Migraine ? ?02/16/2022: Stable >80% improvement in migraine frequency and severity. ? ?Reviewed orally with patient, additionally signature is on file: ? ?Botulism toxin has been approved by the Federal drug administration for treatment of chronic migraine. Botulism toxin does not cure chronic migraine and it may not be effective in some patients. ? ?The administration of botulism toxin is accomplished by injecting a small amount of toxin into the muscles of the neck and head. Dosage must be titrated for each individual. Any benefits resulting from botulism toxin tend to wear off after 3 months with a repeat injection required if benefit is to be maintained. Injections are usually done every 3-4 months with maximum effect peak achieved by about 2 or 3 weeks. Botulism toxin is expensive and you should be sure of what costs you will incur resulting from the injection. ? ?The side effects of botulism toxin use for chronic migraine may include: ? ? -Transient, and usually mild, facial weakness with facial injections ? -Transient, and usually mild, head or neck weakness with head/neck injections ? -Reduction or loss of forehead facial animation due to forehead muscle weakness ? -Eyelid drooping ? -Dry eye ? -Pain at the site of injection or bruising at the site of injection ? -Double vision ? -Potential unknown long term risks ? ?Contraindications: You should not have Botox if you are pregnant, nursing, allergic to albumin, have an infection, skin condition, or muscle weakness at the site of the injection, or have myasthenia gravis, Lambert-Eaton syndrome, or ALS. ? ?It is also possible that as with any injection, there may be an allergic reaction or no effect from the medication. Reduced effectiveness after repeated injections is sometimes seen and rarely infection at the injection site may occur. All care will be taken to prevent these side effects. If therapy is given  over a long time, atrophy and wasting in the muscle injected may occur. Occasionally the patient's become refractory to treatment because they develop antibodies to the toxin. In this event, therapy needs to be modified. ? ?I have read the above information and consent to the administration of botulism toxin. ? ? ? ?BOTOX PROCEDURE NOTE FOR MIGRAINE HEADACHE ? ? ? ?Contraindications and precautions discussed with patient(above). Aseptic procedure was observed and patient tolerated procedure. Procedure performed by Dr. Georgia Dom ? ?The condition has existed for more than 6 months, and pt does not have a diagnosis of ALS, Myasthenia Gravis or Lambert-Eaton Syndrome.  Risks and benefits of injections discussed and pt agrees to proceed with the procedure.  Written consent obtained ? ?These injections are medically necessary. Pt  receives good benefits from these injections. These injections do not cause sedations or hallucinations which the oral therapies may cause. ? ?Description of procedure: ? ?The patient was placed in a sitting position. The standard protocol was used for Botox as follows, with 5 units of Botox injected at each site: ? ? ?-Procerus muscle, midline injection ? ?-Corrugator muscle, bilateral injection ? ?-Frontalis muscle, bilateral injection, with 2 sites each side, medial injection was performed in the upper one third of the frontalis muscle, in the region vertical from the medial inferior edge of the superior orbital rim. The lateral injection was again in the upper one third of the forehead vertically above the lateral limbus of the cornea, 1.5 cm lateral to the medial injection site. ? ?-Temporalis muscle injection, 4 sites, bilaterally. The first injection was 3 cm above the tragus of the ear,  second injection site was 1.5 cm to 3 cm up from the first injection site in line with the tragus of the ear. The third injection site was 1.5-3 cm forward between the first 2 injection sites. The fourth  injection site was 1.5 cm posterior to the second injection site.  ? ?-Occipitalis muscle injection, 3 sites, bilaterally. The first injection was done one half way between the occipital protuberance and the tip of the mastoid process behind the ear. The second injection site was done lateral and superior to the first, 1 fingerbreadth from the first injection. The third injection site was 1 fingerbreadth superiorly and medially from the first injection site. ? ?-Cervical paraspinal muscle injection, 2 sites, bilateral knee first injection site was 1 cm from the midline of the cervical spine, 3 cm inferior to the lower border of the occipital protuberance. The second injection site was 1.5 cm superiorly and laterally to the first injection site. ? ?-Trapezius muscle injection was performed at 3 sites, bilaterally. The first injection site was in the upper trapezius muscle halfway between the inflection point of the neck, and the acromion. The second injection site was one half way between the acromion and the first injection site. The third injection was done between the first injection site and the inflection point of the neck. ? ? ?Will return for repeat injection in 3 months. ? ? ?155 units of Botox was used, 45 Botox not injected was wasted. The patient tolerated the procedure well, there were no complications of the above procedure. ? ? ?

## 2022-03-12 ENCOUNTER — Other Ambulatory Visit (HOSPITAL_COMMUNITY): Payer: Self-pay

## 2022-03-28 ENCOUNTER — Other Ambulatory Visit: Payer: 59

## 2022-04-02 ENCOUNTER — Encounter: Payer: Self-pay | Admitting: Internal Medicine

## 2022-04-02 MED ORDER — TRETINOIN 0.025 % EX CREA
TOPICAL_CREAM | Freq: Every day | CUTANEOUS | 0 refills | Status: DC
  Filled 2022-04-02: qty 45, 30d supply, fill #0

## 2022-04-02 NOTE — Telephone Encounter (Signed)
Pls advise if pk to send..lmb ?

## 2022-04-03 ENCOUNTER — Other Ambulatory Visit: Payer: Self-pay

## 2022-04-03 ENCOUNTER — Other Ambulatory Visit (HOSPITAL_COMMUNITY): Payer: Self-pay

## 2022-04-03 ENCOUNTER — Ambulatory Visit
Admission: RE | Admit: 2022-04-03 | Discharge: 2022-04-03 | Disposition: A | Discharge status: Home / Self Care | Payer: 59 | Source: Ambulatory Visit | Attending: Obstetrics | Admitting: Obstetrics

## 2022-04-03 DIAGNOSIS — N6002 Solitary cyst of left breast: Secondary | ICD-10-CM | POA: Diagnosis not present

## 2022-04-03 DIAGNOSIS — N6320 Unspecified lump in the left breast, unspecified quadrant: Secondary | ICD-10-CM

## 2022-04-03 MED ORDER — TRETINOIN 0.025 % EX CREA: TOPICAL_CREAM | Freq: Every day | CUTANEOUS | 0 refills | Status: DC

## 2022-04-12 ENCOUNTER — Other Ambulatory Visit (HOSPITAL_COMMUNITY): Payer: Self-pay

## 2022-04-12 DIAGNOSIS — L218 Other seborrheic dermatitis: Secondary | ICD-10-CM | POA: Diagnosis not present

## 2022-04-12 MED ORDER — HYDROCORTISONE 2.5 % EX OINT
1.0000 "application " | TOPICAL_OINTMENT | Freq: Two times a day (BID) | CUTANEOUS | 1 refills | Status: DC
  Filled 2022-04-12: qty 28.35, 20d supply, fill #0

## 2022-05-07 ENCOUNTER — Other Ambulatory Visit (HOSPITAL_COMMUNITY): Payer: Self-pay

## 2022-05-08 ENCOUNTER — Other Ambulatory Visit (HOSPITAL_COMMUNITY): Payer: Self-pay

## 2022-05-09 ENCOUNTER — Other Ambulatory Visit (HOSPITAL_COMMUNITY): Payer: Self-pay

## 2022-05-09 MED ORDER — TACROLIMUS 0.1 % EX OINT
1.0000 "application " | TOPICAL_OINTMENT | Freq: Two times a day (BID) | CUTANEOUS | 0 refills | Status: AC | PRN
  Filled 2022-05-09: qty 30, 15d supply, fill #0

## 2022-05-15 ENCOUNTER — Ambulatory Visit: Payer: 59 | Admitting: Neurology

## 2022-07-03 ENCOUNTER — Ambulatory Visit: Payer: 59 | Admitting: Neurology

## 2022-07-03 DIAGNOSIS — G43709 Chronic migraine without aura, not intractable, without status migrainosus: Secondary | ICD-10-CM | POA: Diagnosis not present

## 2022-07-03 MED ORDER — ONABOTULINUMTOXINA 200 UNITS IJ SOLR: 155.0000 [IU] | Freq: Once | INTRAMUSCULAR | Status: AC

## 2022-07-03 NOTE — Progress Notes (Signed)
Consent Form Botulism Toxin Injection For Chronic Migraine  07/03/2022: stable 02/16/2022: Stable >80% improvement in migraine frequency and severity.  Reviewed orally with patient, additionally signature is on file:  Botulism toxin has been approved by the Federal drug administration for treatment of chronic migraine. Botulism toxin does not cure chronic migraine and it may not be effective in some patients.  The administration of botulism toxin is accomplished by injecting a small amount of toxin into the muscles of the neck and head. Dosage must be titrated for each individual. Any benefits resulting from botulism toxin tend to wear off after 3 months with a repeat injection required if benefit is to be maintained. Injections are usually done every 3-4 months with maximum effect peak achieved by about 2 or 3 weeks. Botulism toxin is expensive and you should be sure of what costs you will incur resulting from the injection.  The side effects of botulism toxin use for chronic migraine may include:   -Transient, and usually mild, facial weakness with facial injections  -Transient, and usually mild, head or neck weakness with head/neck injections  -Reduction or loss of forehead facial animation due to forehead muscle weakness  -Eyelid drooping  -Dry eye  -Pain at the site of injection or bruising at the site of injection  -Double vision  -Potential unknown long term risks  Contraindications: You should not have Botox if you are pregnant, nursing, allergic to albumin, have an infection, skin condition, or muscle weakness at the site of the injection, or have myasthenia gravis, Lambert-Eaton syndrome, or ALS.  It is also possible that as with any injection, there may be an allergic reaction or no effect from the medication. Reduced effectiveness after repeated injections is sometimes seen and rarely infection at the injection site may occur. All care will be taken to prevent these side effects.  If therapy is given over a long time, atrophy and wasting in the muscle injected may occur. Occasionally the patient's become refractory to treatment because they develop antibodies to the toxin. In this event, therapy needs to be modified.  I have read the above information and consent to the administration of botulism toxin.    BOTOX PROCEDURE NOTE FOR MIGRAINE HEADACHE    Contraindications and precautions discussed with patient(above). Aseptic procedure was observed and patient tolerated procedure. Procedure performed by Dr. Toni Moiz Ryant  The condition has existed for more than 6 months, and pt does not have a diagnosis of ALS, Myasthenia Gravis or Lambert-Eaton Syndrome.  Risks and benefits of injections discussed and pt agrees to proceed with the procedure.  Written consent obtained  These injections are medically necessary. Pt  receives good benefits from these injections. These injections do not cause sedations or hallucinations which the oral therapies may cause.  Description of procedure:  The patient was placed in a sitting position. The standard protocol was used for Botox as follows, with 5 units of Botox injected at each site:   -Procerus muscle, midline injection  -Corrugator muscle, bilateral injection  -Frontalis muscle, bilateral injection, with 2 sites each side, medial injection was performed in the upper one third of the frontalis muscle, in the region vertical from the medial inferior edge of the superior orbital rim. The lateral injection was again in the upper one third of the forehead vertically above the lateral limbus of the cornea, 1.5 cm lateral to the medial injection site.  -Temporalis muscle injection, 4 sites, bilaterally. The first injection was 3 cm above the tragus of   the ear, second injection site was 1.5 cm to 3 cm up from the first injection site in line with the tragus of the ear. The third injection site was 1.5-3 cm forward between the first 2  injection sites. The fourth injection site was 1.5 cm posterior to the second injection site.   -Occipitalis muscle injection, 3 sites, bilaterally. The first injection was done one half way between the occipital protuberance and the tip of the mastoid process behind the ear. The second injection site was done lateral and superior to the first, 1 fingerbreadth from the first injection. The third injection site was 1 fingerbreadth superiorly and medially from the first injection site.  -Cervical paraspinal muscle injection, 2 sites, bilateral knee first injection site was 1 cm from the midline of the cervical spine, 3 cm inferior to the lower border of the occipital protuberance. The second injection site was 1.5 cm superiorly and laterally to the first injection site.  -Trapezius muscle injection was performed at 3 sites, bilaterally. The first injection site was in the upper trapezius muscle halfway between the inflection point of the neck, and the acromion. The second injection site was one half way between the acromion and the first injection site. The third injection was done between the first injection site and the inflection point of the neck.   Will return for repeat injection in 3 months.   155 units of Botox was used, 45 Botox not injected was wasted. The patient tolerated the procedure well, there were no complications of the above procedure.   

## 2022-07-03 NOTE — Progress Notes (Signed)
Botox- 200 units x 1 vial Lot: [id number] Expiration: 01/2025 [facility]: 0881-1031-59  Bacteriostatic 0.9% Sodium Chloride- 68m total Lot: [id number]: 07/18/2023 [facility]: [address]-38 Dx: [id number] B/B

## 2022-08-13 ENCOUNTER — Other Ambulatory Visit (HOSPITAL_COMMUNITY): Payer: Self-pay

## 2022-08-15 ENCOUNTER — Other Ambulatory Visit: Payer: Self-pay | Admitting: Internal Medicine

## 2022-08-15 DIAGNOSIS — Z1231 Encounter for screening mammogram for malignant neoplasm of breast: Secondary | ICD-10-CM

## 2022-08-27 DIAGNOSIS — Z961 Presence of intraocular lens: Secondary | ICD-10-CM | POA: Diagnosis not present

## 2022-08-27 DIAGNOSIS — H25041 Posterior subcapsular polar age-related cataract, right eye: Secondary | ICD-10-CM | POA: Diagnosis not present

## 2022-09-07 ENCOUNTER — Other Ambulatory Visit (HOSPITAL_COMMUNITY): Payer: Self-pay

## 2022-09-25 ENCOUNTER — Ambulatory Visit: Payer: 59 | Admitting: Neurology

## 2022-10-02 ENCOUNTER — Ambulatory Visit: Payer: 59

## 2022-10-29 ENCOUNTER — Ambulatory Visit: Payer: 59 | Admitting: Neurology

## 2022-10-30 ENCOUNTER — Ambulatory Visit
Admission: RE | Admit: 2022-10-30 | Discharge: 2022-10-30 | Disposition: A | Discharge status: Home / Self Care | Payer: 59 | Source: Ambulatory Visit | Attending: Internal Medicine | Admitting: Internal Medicine

## 2022-10-30 DIAGNOSIS — Z1231 Encounter for screening mammogram for malignant neoplasm of breast: Secondary | ICD-10-CM | POA: Diagnosis not present

## 2022-10-31 ENCOUNTER — Ambulatory Visit: Payer: 59 | Admitting: Neurology

## 2022-10-31 DIAGNOSIS — G43709 Chronic migraine without aura, not intractable, without status migrainosus: Secondary | ICD-10-CM | POA: Diagnosis not present

## 2022-10-31 MED ORDER — ONABOTULINUMTOXINA 200 UNITS IJ SOLR
155.0000 [IU] | Freq: Once | INTRAMUSCULAR | Status: AC
  Administered 2022-10-31: 155 [IU] via INTRAMUSCULAR

## 2022-10-31 NOTE — Progress Notes (Signed)
Consent Form Botulism Toxin Injection For Chronic Migraine  07/03/2022: stable 02/16/2022: Stable >80% improvement in migraine frequency and severity.  Reviewed orally with patient, additionally signature is on file:  Botulism toxin has been approved by the Federal drug administration for treatment of chronic migraine. Botulism toxin does not cure chronic migraine and it may not be effective in some patients.  The administration of botulism toxin is accomplished by injecting a small amount of toxin into the muscles of the neck and head. Dosage must be titrated for each individual. Any benefits resulting from botulism toxin tend to wear off after 3 months with a repeat injection required if benefit is to be maintained. Injections are usually done every 3-4 months with maximum effect peak achieved by about 2 or 3 weeks. Botulism toxin is expensive and you should be sure of what costs you will incur resulting from the injection.  The side effects of botulism toxin use for chronic migraine may include:   -Transient, and usually mild, facial weakness with facial injections  -Transient, and usually mild, head or neck weakness with head/neck injections  -Reduction or loss of forehead facial animation due to forehead muscle weakness  -Eyelid drooping  -Dry eye  -Pain at the site of injection or bruising at the site of injection  -Double vision  -Potential unknown long term risks  Contraindications: You should not have Botox if you are pregnant, nursing, allergic to albumin, have an infection, skin condition, or muscle weakness at the site of the injection, or have myasthenia gravis, Lambert-Eaton syndrome, or ALS.  It is also possible that as with any injection, there may be an allergic reaction or no effect from the medication. Reduced effectiveness after repeated injections is sometimes seen and rarely infection at the injection site may occur. All care will be taken to prevent these side effects.  If therapy is given over a long time, atrophy and wasting in the muscle injected may occur. Occasionally the patient's become refractory to treatment because they develop antibodies to the toxin. In this event, therapy needs to be modified.  I have read the above information and consent to the administration of botulism toxin.    BOTOX PROCEDURE NOTE FOR MIGRAINE HEADACHE    Contraindications and precautions discussed with patient(above). Aseptic procedure was observed and patient tolerated procedure. Procedure performed by Dr. Georgia Dom  The condition has existed for more than 6 months, and pt does not have a diagnosis of ALS, Myasthenia Gravis or Lambert-Eaton Syndrome.  Risks and benefits of injections discussed and pt agrees to proceed with the procedure.  Written consent obtained  These injections are medically necessary. Pt  receives good benefits from these injections. These injections do not cause sedations or hallucinations which the oral therapies may cause.  Description of procedure:  The patient was placed in a sitting position. The standard protocol was used for Botox as follows, with 5 units of Botox injected at each site:   -Procerus muscle, midline injection  -Corrugator muscle, bilateral injection  -Frontalis muscle, bilateral injection, with 2 sites each side, medial injection was performed in the upper one third of the frontalis muscle, in the region vertical from the medial inferior edge of the superior orbital rim. The lateral injection was again in the upper one third of the forehead vertically above the lateral limbus of the cornea, 1.5 cm lateral to the medial injection site.  -Temporalis muscle injection, 4 sites, bilaterally. The first injection was 3 cm above the tragus of  the ear, second injection site was 1.5 cm to 3 cm up from the first injection site in line with the tragus of the ear. The third injection site was 1.5-3 cm forward between the first 2  injection sites. The fourth injection site was 1.5 cm posterior to the second injection site.   -Occipitalis muscle injection, 3 sites, bilaterally. The first injection was done one half way between the occipital protuberance and the tip of the mastoid process behind the ear. The second injection site was done lateral and superior to the first, 1 fingerbreadth from the first injection. The third injection site was 1 fingerbreadth superiorly and medially from the first injection site.  -Cervical paraspinal muscle injection, 2 sites, bilateral knee first injection site was 1 cm from the midline of the cervical spine, 3 cm inferior to the lower border of the occipital protuberance. The second injection site was 1.5 cm superiorly and laterally to the first injection site.  -Trapezius muscle injection was performed at 3 sites, bilaterally. The first injection site was in the upper trapezius muscle halfway between the inflection point of the neck, and the acromion. The second injection site was one half way between the acromion and the first injection site. The third injection was done between the first injection site and the inflection point of the neck.   Will return for repeat injection in 3 months.   155 units of Botox was used, 45 Botox not injected was wasted. The patient tolerated the procedure well, there were no complications of the above procedure.

## 2022-10-31 NOTE — Progress Notes (Unsigned)
Botox- 200 units x 1 vial Lot: [id number] Expiration: 03/2025 [facility]: 4562-5638-93  Bacteriostatic 0.9% Sodium Chloride- 34m total Lot: [id number]: 08/18/2023 [facility]: [address]-20 Dx: [id number]/B

## 2022-11-23 ENCOUNTER — Encounter: Payer: Self-pay | Admitting: Internal Medicine

## 2022-11-23 ENCOUNTER — Other Ambulatory Visit (HOSPITAL_COMMUNITY): Payer: Self-pay

## 2022-11-23 ENCOUNTER — Other Ambulatory Visit: Payer: Self-pay | Admitting: Internal Medicine

## 2022-11-23 MED ORDER — TRETINOIN 0.025 % EX CREA: TOPICAL_CREAM | Freq: Every day | CUTANEOUS | 0 refills | Status: DC

## 2022-11-26 ENCOUNTER — Other Ambulatory Visit (HOSPITAL_COMMUNITY): Payer: Self-pay

## 2023-01-02 ENCOUNTER — Other Ambulatory Visit (HOSPITAL_COMMUNITY): Payer: Self-pay

## 2023-01-02 ENCOUNTER — Encounter: Payer: Self-pay | Admitting: Internal Medicine

## 2023-01-02 MED ORDER — HYDROCOD POLI-CHLORPHE POLI ER 10-8 MG/5ML PO SUER: 5.0000 mL | Freq: Two times a day (BID) | ORAL | 0 refills | Status: DC | PRN

## 2023-01-02 MED ORDER — HYDROCOD POLI-CHLORPHE POLI ER 10-8 MG/5ML PO SUER
5.0000 mL | Freq: Two times a day (BID) | ORAL | 0 refills | Status: DC | PRN
  Filled 2023-01-02: qty 115, 12d supply, fill #0

## 2023-01-02 NOTE — Addendum Note (Signed)
Addended by: Binnie Rail on: 01/02/2023 09:12 PM   Modules accepted: Orders

## 2023-01-03 ENCOUNTER — Other Ambulatory Visit (HOSPITAL_COMMUNITY): Payer: Self-pay

## 2023-01-03 MED ORDER — PREDNISONE 10 MG PO TABS
ORAL_TABLET | ORAL | 0 refills | Status: AC
  Filled 2023-01-03: qty 30, 12d supply, fill #0

## 2023-01-16 ENCOUNTER — Telehealth: Payer: Self-pay | Admitting: Neurology

## 2023-01-16 NOTE — Telephone Encounter (Signed)
Please submit a botox PA with her new [employer] insurance 200 units every 12 weeks for migraines [id number]/G43.709

## 2023-01-17 ENCOUNTER — Other Ambulatory Visit (HOSPITAL_COMMUNITY): Payer: Self-pay

## 2023-01-17 NOTE — Telephone Encounter (Signed)
[employer]otox[employer]-Benefit Verification [id number] Submitted!

## 2023-01-17 NOTE — Telephone Encounter (Signed)
Chronic Migraine CPT 64615  Botox J0585 Units:200  G43.709 Chronic Migraine without aura, not intractable, without status migrainous   

## 2023-01-17 NOTE — Telephone Encounter (Signed)
Pharmacy Patient Advocate Encounter   Received notification from [place] that prior authorization for Botox 200UNIT solution is required/requested.    PA submitted on 01/17/2023 to (ins) [employer] via CoverMyMeds Key [id number]  Status is pending

## 2023-01-21 ENCOUNTER — Other Ambulatory Visit (HOSPITAL_COMMUNITY): Payer: Self-pay

## 2023-01-21 ENCOUNTER — Other Ambulatory Visit: Payer: Self-pay

## 2023-01-21 MED ORDER — ONABOTULINUMTOXINA 200 UNITS IJ SOLR
INTRAMUSCULAR | 3 refills | Status: DC
  Filled 2023-01-21: qty 1, 84d supply, fill #0
  Filled 2023-06-12: qty 1, 84d supply, fill #1
  Filled 2023-09-04: qty 1, 84d supply, fill #2
  Filled 2023-12-17: qty 1, 84d supply, fill #3

## 2023-01-21 NOTE — Addendum Note (Signed)
Addended by: Gildardo Griffes on: 01/21/2023 12:05 PM   Modules accepted: Orders

## 2023-01-21 NOTE — Telephone Encounter (Signed)
Pharmacy Patient Advocate Encounter  Prior Authorization for Botox 200UNIT solution has been approved.    PA# PA Case ID #: [id number] Effective dates: 01/17/2023 through 01/17/2024 Per test billing copay is $750.00 This can be filled with [facility]

## 2023-01-21 NOTE — Telephone Encounter (Signed)
Botox Rx sent to [facility]

## 2023-01-22 ENCOUNTER — Ambulatory Visit: Payer: Commercial Managed Care - PPO | Admitting: Neurology

## 2023-01-22 DIAGNOSIS — G43709 Chronic migraine without aura, not intractable, without status migrainosus: Secondary | ICD-10-CM

## 2023-01-22 MED ORDER — ONABOTULINUMTOXINA 200 UNITS IJ SOLR: 155.0000 [IU] | Freq: Once | INTRAMUSCULAR | Status: AC

## 2023-01-22 NOTE — Progress Notes (Signed)
Botox- 200 units x 1 vial  SAMPLES.  Last given 10-31-2022 Lot: [id number] Expiration: 02/2025 NDC: 2241-1464-31  Bacteriostatic 0.9% Sodium Chloride- 68m total Lot: [id number]: [id number]: 643539-122-58 Dx: [id number]

## 2023-01-22 NOTE — Progress Notes (Signed)
Consent Form Botulism Toxin Injection For Chronic Migraine  11/01/2022: stable 07/03/2022: stable 02/16/2022: Stable >80% improvement in migraine frequency and severity.  Reviewed orally with patient, additionally signature is on file:  Botulism toxin has been approved by the Federal drug administration for treatment of chronic migraine. Botulism toxin does not cure chronic migraine and it may not be effective in some patients.  The administration of botulism toxin is accomplished by injecting a small amount of toxin into the muscles of the neck and head. Dosage must be titrated for each individual. Any benefits resulting from botulism toxin tend to wear off after 3 months with a repeat injection required if benefit is to be maintained. Injections are usually done every 3-4 months with maximum effect peak achieved by about 2 or 3 weeks. Botulism toxin is expensive and you should be sure of what costs you will incur resulting from the injection.  The side effects of botulism toxin use for chronic migraine may include:   -Transient, and usually mild, facial weakness with facial injections  -Transient, and usually mild, head or neck weakness with head/neck injections  -Reduction or loss of forehead facial animation due to forehead muscle weakness  -Eyelid drooping  -Dry eye  -Pain at the site of injection or bruising at the site of injection  -Double vision  -Potential unknown long term risks  Contraindications: You should not have Botox if you are pregnant, nursing, allergic to albumin, have an infection, skin condition, or muscle weakness at the site of the injection, or have myasthenia gravis, Lambert-Eaton syndrome, or ALS.  It is also possible that as with any injection, there may be an allergic reaction or no effect from the medication. Reduced effectiveness after repeated injections is sometimes seen and rarely infection at the injection site may occur. All care will be taken to prevent  these side effects. If therapy is given over a long time, atrophy and wasting in the muscle injected may occur. Occasionally the patient's become refractory to treatment because they develop antibodies to the toxin. In this event, therapy needs to be modified.  I have read the above information and consent to the administration of botulism toxin.    BOTOX PROCEDURE NOTE FOR MIGRAINE HEADACHE    Contraindications and precautions discussed with patient(above). Aseptic procedure was observed and patient tolerated procedure. Procedure performed by Dr. Georgia Dom  The condition has existed for more than 6 months, and pt does not have a diagnosis of ALS, Myasthenia Gravis or Lambert-Eaton Syndrome.  Risks and benefits of injections discussed and pt agrees to proceed with the procedure.  Written consent obtained  These injections are medically necessary. Pt  receives good benefits from these injections. These injections do not cause sedations or hallucinations which the oral therapies may cause.  Description of procedure:  The patient was placed in a sitting position. The standard protocol was used for Botox as follows, with 5 units of Botox injected at each site:   -Procerus muscle, midline injection  -Corrugator muscle, bilateral injection  -Frontalis muscle, bilateral injection, with 2 sites each side, medial injection was performed in the upper one third of the frontalis muscle, in the region vertical from the medial inferior edge of the superior orbital rim. The lateral injection was again in the upper one third of the forehead vertically above the lateral limbus of the cornea, 1.5 cm lateral to the medial injection site.  -Temporalis muscle injection, 4 sites, bilaterally. The first injection was 3 cm above the  tragus of the ear, second injection site was 1.5 cm to 3 cm up from the first injection site in line with the tragus of the ear. The third injection site was 1.5-3 cm forward between  the first 2 injection sites. The fourth injection site was 1.5 cm posterior to the second injection site.   -Occipitalis muscle injection, 3 sites, bilaterally. The first injection was done one half way between the occipital protuberance and the tip of the mastoid process behind the ear. The second injection site was done lateral and superior to the first, 1 fingerbreadth from the first injection. The third injection site was 1 fingerbreadth superiorly and medially from the first injection site.  -Cervical paraspinal muscle injection, 2 sites, bilateral knee first injection site was 1 cm from the midline of the cervical spine, 3 cm inferior to the lower border of the occipital protuberance. The second injection site was 1.5 cm superiorly and laterally to the first injection site.  -Trapezius muscle injection was performed at 3 sites, bilaterally. The first injection site was in the upper trapezius muscle halfway between the inflection point of the neck, and the acromion. The second injection site was one half way between the acromion and the first injection site. The third injection was done between the first injection site and the inflection point of the neck.   Will return for repeat injection in 3 months.   155 units of Botox was used, 45 Botox not injected was wasted. The patient tolerated the procedure well, there were no complications of the above procedure.  I spent 30 minutes of face-to-face and non-face-to-face time with patient on the  1. Chronic migraine without aura without status migrainosus, not intractable    diagnosis.  This included previsit chart review, lab review, study review, order entry, electronic health record documentation, patient education on the different diagnostic and therapeutic options, counseling and coordination of care, risks and benefits of management, compliance, or risk factor reduction

## 2023-02-17 ENCOUNTER — Other Ambulatory Visit: Payer: Self-pay | Admitting: Internal Medicine

## 2023-02-18 ENCOUNTER — Other Ambulatory Visit (HOSPITAL_COMMUNITY): Payer: Self-pay

## 2023-02-18 DIAGNOSIS — Z961 Presence of intraocular lens: Secondary | ICD-10-CM | POA: Diagnosis not present

## 2023-02-18 DIAGNOSIS — H524 Presbyopia: Secondary | ICD-10-CM | POA: Diagnosis not present

## 2023-02-18 DIAGNOSIS — H25041 Posterior subcapsular polar age-related cataract, right eye: Secondary | ICD-10-CM | POA: Diagnosis not present

## 2023-02-18 MED ORDER — LINACLOTIDE 290 MCG PO CAPS
290.0000 ug | ORAL_CAPSULE | Freq: Every day | ORAL | 0 refills | Status: DC
  Filled 2023-02-18: qty 30, 30d supply, fill #0

## 2023-02-18 MED ORDER — LEVOTHYROXINE SODIUM 75 MCG PO TABS
75.0000 ug | ORAL_TABLET | Freq: Every day | ORAL | 0 refills | Status: DC
  Filled 2023-02-18: qty 30, 30d supply, fill #0

## 2023-02-19 ENCOUNTER — Other Ambulatory Visit (HOSPITAL_COMMUNITY): Payer: Self-pay

## 2023-02-28 ENCOUNTER — Encounter: Payer: Self-pay | Admitting: Internal Medicine

## 2023-02-28 NOTE — Patient Instructions (Addendum)
Blood work was ordered.   The lab is on the first floor.    Medications changes include :   none    Return in about 1 year (around 02/29/2024) for Physical Exam.    Health Maintenance, Female Adopting a healthy lifestyle and getting preventive care are important in promoting health and wellness. Ask your health care provider about: The right schedule for you to have regular tests and exams. Things you can do on your own to prevent diseases and keep yourself healthy. What should I know about diet, weight, and exercise? Eat a healthy diet  Eat a diet that includes plenty of vegetables, fruits, low-fat dairy products, and lean protein. Do not eat a lot of foods that are high in solid fats, added sugars, or sodium. Maintain a healthy weight Body mass index (BMI) is used to identify weight problems. It estimates body fat based on height and weight. Your health care provider can help determine your BMI and help you achieve or maintain a healthy weight. Get regular exercise Get regular exercise. This is one of the most important things you can do for your health. Most adults should: Exercise for at least 150 minutes each week. The exercise should increase your heart rate and make you sweat (moderate-intensity exercise). Do strengthening exercises at least twice a week. This is in addition to the moderate-intensity exercise. Spend less time sitting. Even light physical activity can be beneficial. Watch cholesterol and blood lipids Have your blood tested for lipids and cholesterol at 47 years of age, then have this test every 5 years. Have your cholesterol levels checked more often if: Your lipid or cholesterol levels are high. You are older than 47 years of age. You are at high risk for heart disease. What should I know about cancer screening? Depending on your health history and family history, you may need to have cancer screening at various ages. This may include screening  for: Breast cancer. Cervical cancer. Colorectal cancer. Skin cancer. Lung cancer. What should I know about heart disease, diabetes, and high blood pressure? Blood pressure and heart disease High blood pressure causes heart disease and increases the risk of stroke. This is more likely to develop in people who have high blood pressure readings or are overweight. Have your blood pressure checked: Every 3-5 years if you are 74-38 years of age. Every year if you are 28 years old or older. Diabetes Have regular diabetes screenings. This checks your fasting blood sugar level. Have the screening done: Once every three years after age 52 if you are at a normal weight and have a low risk for diabetes. More often and at a younger age if you are overweight or have a high risk for diabetes. What should I know about preventing infection? Hepatitis B If you have a higher risk for hepatitis B, you should be screened for this virus. Talk with your health care provider to find out if you are at risk for hepatitis B infection. Hepatitis C Testing is recommended for: Everyone born from [birth year] through 1965. Anyone with known risk factors for hepatitis C. Sexually transmitted infections (STIs) Get screened for STIs, including gonorrhea and chlamydia, if: You are sexually active and are younger than 47 years of age. You are older than 47 years of age and your health care provider tells you that you are at risk for this type of infection. Your sexual activity has changed since you were last screened, and you are at  increased risk for chlamydia or gonorrhea. Ask your health care provider if you are at risk. Ask your health care provider about whether you are at high risk for HIV. Your health care provider may recommend a prescription medicine to help prevent HIV infection. If you choose to take medicine to prevent HIV, you should first get tested for HIV. You should then be tested every 3 months for as long as you  are taking the medicine. Pregnancy If you are about to stop having your period (premenopausal) and you may become pregnant, seek counseling before you get pregnant. Take 400 to 800 micrograms (mcg) of folic acid every day if you become pregnant. Ask for birth control (contraception) if you want to prevent pregnancy. Osteoporosis and menopause Osteoporosis is a disease in which the bones lose minerals and strength with aging. This can result in bone fractures. If you are 86 years old or older, or if you are at risk for osteoporosis and fractures, ask your health care provider if you should: Be screened for bone loss. Take a calcium or vitamin D supplement to lower your risk of fractures. Be given hormone replacement therapy (HRT) to treat symptoms of menopause. Follow these instructions at home: Alcohol use Do not drink alcohol if: Your health care provider tells you not to drink. You are pregnant, may be pregnant, or are planning to become pregnant. If you drink alcohol: Limit how much you have to: 0-1 drink a day. Know how much alcohol is in your drink. In the [place] one drink equals one 12 oz bottle of beer (355 mL), one 5 oz glass of wine (148 mL), or one 1 oz glass of hard liquor (44 mL). Lifestyle Do not use any products that contain nicotine or tobacco. These products include cigarettes, chewing tobacco, and vaping devices, such as e-cigarettes. If you need help quitting, ask your health care provider. Do not use street drugs. Do not share needles. Ask your health care provider for help if you need support or information about quitting drugs. General instructions Schedule regular health, dental, and eye exams. Stay current with your vaccines. Tell your health care provider if: You often feel depressed. You have ever been abused or do not feel safe at home. Summary Adopting a healthy lifestyle and getting preventive care are important in promoting health and wellness. Follow your  health care provider's instructions about healthy diet, exercising, and getting tested or screened for diseases. Follow your health care provider's instructions on monitoring your cholesterol and blood pressure. This information is not intended to replace advice given to you by your health care provider. Make sure you discuss any questions you have with your health care provider. Document Revised: 04/24/2021 Document Reviewed: 04/24/2021 Elsevier Patient Education  [place].

## 2023-02-28 NOTE — Progress Notes (Signed)
Subjective:    Patient ID: Cynthia Ramsey, female    DOB: [birth year], 47 y.o.   MRN: [id number]      HPI Cynthia Ramsey is here for a Physical exam and her chronic medical problems.    Overall doing well.  Medications and allergies reviewed with patient and updated if appropriate.  Current Outpatient Medications on File Prior to Visit  Medication Sig Dispense Refill   botulinum toxin Type A (BOTOX) 200 units injection Provider to inject 155 units into the muscles of the head and neck every 12 weeks. Discard remainder. 1 each 3   Cholecalciferol (VITAMIN D) 2000 UNITS tablet Take 1 tablet (2,000 Units total) by mouth daily. 30 tablet 11   fluticasone (FLONASE) 50 MCG/ACT nasal spray INHALE 1 SPRAY INTO EACH NOSTRILS DAILY. (Patient taking differently: daily as needed.) 16 g 11   linaclotide (LINZESS) 290 MCG CAPS capsule Take 1 capsule (290 mcg total) by mouth 30 minutes before the first meal of the day. 30 capsule 0   loratadine (CLARITIN) 10 MG tablet Take 10 mg by mouth daily as needed.     Multiple Vitamin (MULTIVITAMIN) tablet Take 1 tablet by mouth daily.     rizatriptan (MAXALT-MLT) 10 MG disintegrating tablet Take 1 tablet (10 mg total) by mouth as needed for migraine. May repeat in 2 hours if needed 9 tablet 12   tacrolimus (PROTOPIC) 0.1 % ointment Apply 1 application (a small amount) topically to skin 2 (two) times daily as needed. 30 g 0   Current Facility-Administered Medications on File Prior to Visit  Medication Dose Route Frequency Provider Last Rate Last Admin   botulinum toxin Type A (BOTOX) injection 155 Units  155 Units Intramuscular Once Melvenia Beam, MD       botulinum toxin Type A (BOTOX) injection 155 Units  155 Units Intramuscular Once Melvenia Beam, MD        Review of Systems  Constitutional:  Negative for fever.  Eyes:  Negative for visual disturbance.  Respiratory:  Negative for cough, shortness of breath and wheezing.   Cardiovascular:  Negative  for chest pain, palpitations and leg swelling.  Gastrointestinal:  Positive for constipation. Negative for abdominal pain, blood in stool and diarrhea.       Occ gerd  Genitourinary:  Negative for dysuria.  Musculoskeletal:  Negative for arthralgias and back pain.  Skin:  Negative for rash.  Neurological:  Positive for headaches (migraines). Negative for light-headedness.  Psychiatric/Behavioral:  Negative for dysphoric mood. The patient is not nervous/anxious.        Objective:   Vitals:   03/01/23 0849  BP: 104/68  Pulse: 75  Temp: 98.1 F (36.7 C)  SpO2: 98%   Filed Weights   03/01/23 0849  Weight: 116 lb (52.6 kg)   Body mass index is 21.22 kg/m.  BP Readings from Last 3 Encounters:  03/01/23 104/68  02/16/22 95/66  12/26/21 110/78    Wt Readings from Last 3 Encounters:  03/01/23 116 lb (52.6 kg)  12/26/21 117 lb (53.1 kg)  06/22/19 109 lb (49.4 kg)       Physical Exam Constitutional: She appears well-developed and well-nourished. No distress.  HENT:  Head: Normocephalic and atraumatic.  Right Ear: External ear normal. Normal ear canal and TM Left Ear: External ear normal.  Normal ear canal and TM Mouth/Throat: Oropharynx is clear and moist.  Eyes: Conjunctivae normal.  Neck: Neck supple. No tracheal deviation present. No thyromegaly present.  No carotid bruit  Cardiovascular: Normal rate, regular rhythm and normal heart sounds.   No murmur heard.  No edema. Pulmonary/Chest: Effort normal and breath sounds normal. No respiratory distress. She has no wheezes. She has no rales.  Breast: deferred   Abdominal: Soft. She exhibits no distension. There is no tenderness.  Lymphadenopathy: She has no cervical adenopathy.  Skin: Skin is warm and dry. She is not diaphoretic.  Psychiatric: She has a normal mood and affect. Her behavior is normal.     Lab Results  Component Value Date   WBC 5.2 01/09/2022   HGB 12.7 01/09/2022   HCT 38.9 01/09/2022   PLT  228.0 01/09/2022   GLUCOSE 95 01/09/2022   CHOL 161 01/09/2022   TRIG 89.0 01/09/2022   HDL 56.20 01/09/2022   LDLCALC 87 01/09/2022   ALT 21 01/09/2022   AST 21 01/09/2022   NA 139 01/09/2022   K 4.4 01/09/2022   CL 108 01/09/2022   CREATININE 0.71 01/09/2022   BUN 14 01/09/2022   CO2 27 01/09/2022   TSH 1.41 01/09/2022   HGBA1C 5.3 04/03/2016         Assessment & Plan:   Physical exam: Screening blood work  ordered Exercise not regular -- will restart Weight  good Substance abuse  none   Reviewed recommended immunizations.   Health Maintenance  Topic Date Due   HIV Screening  Never done   PAP SMEAR-Modifier  01/17/2022   COVID-19 Vaccine (4 - 2023-24 season) 03/17/2023 (Originally 08/17/2022)   MAMMOGRAM  10/30/2024   COLONOSCOPY (Pts 45-29yrs Insurance coverage will need to be confirmed)  08/08/2031   DTaP/Tdap/Td (3 - Td or Tdap) 12/27/2031   INFLUENZA VACCINE  Completed   Hepatitis C Screening  Completed   HPV VACCINES  Aged Out          See Problem List for Assessment and Plan of chronic medical problems.

## 2023-03-01 ENCOUNTER — Other Ambulatory Visit (HOSPITAL_COMMUNITY): Payer: Self-pay

## 2023-03-01 ENCOUNTER — Ambulatory Visit (INDEPENDENT_AMBULATORY_CARE_PROVIDER_SITE_OTHER): Payer: Commercial Managed Care - PPO | Admitting: Internal Medicine

## 2023-03-01 VITALS — BP 104/68 | HR 75 | Temp 98.1°F | Ht 62.0 in | Wt 116.0 lb

## 2023-03-01 DIAGNOSIS — Z0000 Encounter for general adult medical examination without abnormal findings: Secondary | ICD-10-CM | POA: Diagnosis not present

## 2023-03-01 DIAGNOSIS — E038 Other specified hypothyroidism: Secondary | ICD-10-CM

## 2023-03-01 DIAGNOSIS — E559 Vitamin D deficiency, unspecified: Secondary | ICD-10-CM | POA: Diagnosis not present

## 2023-03-01 DIAGNOSIS — K5909 Other constipation: Secondary | ICD-10-CM

## 2023-03-01 DIAGNOSIS — G43709 Chronic migraine without aura, not intractable, without status migrainosus: Secondary | ICD-10-CM | POA: Diagnosis not present

## 2023-03-01 LAB — TSH: TSH: 1.37 u[IU]/mL (ref 0.35–5.50)

## 2023-03-01 LAB — LIPID PANEL
Cholesterol: 184 mg/dL (ref 0–200)
HDL: 61.6 mg/dL (ref >39.00)
LDL Cholesterol: 101 mg/dL — ABNORMAL HIGH (ref 0–99)
NonHDL: 122
Total CHOL/HDL Ratio: 3
Triglycerides: 103 mg/dL (ref 0.0–149.0)
VLDL: 20.6 mg/dL (ref 0.0–40.0)

## 2023-03-01 LAB — CBC WITH DIFFERENTIAL/PLATELET
Basophils Absolute: 0.1 10*3/uL (ref 0.0–0.1)
Basophils Relative: 1 % (ref 0.0–3.0)
Eosinophils Absolute: 0.2 10*3/uL (ref 0.0–0.7)
Eosinophils Relative: 3.1 % (ref 0.0–5.0)
HCT: 41.7 % (ref 36.0–46.0)
Hemoglobin: 14 g/dL (ref 12.0–15.0)
Lymphocytes Relative: 30.5 % (ref 12.0–46.0)
Lymphs Abs: 1.8 10*3/uL (ref 0.7–4.0)
MCHC: 33.6 g/dL (ref 30.0–36.0)
MCV: 91.1 fl (ref 78.0–100.0)
Monocytes Absolute: 0.4 10*3/uL (ref 0.1–1.0)
Monocytes Relative: 7.2 % (ref 3.0–12.0)
Neutro Abs: 3.4 10*3/uL (ref 1.4–7.7)
Neutrophils Relative %: 58.2 % (ref 43.0–77.0)
Platelets: 251 10*3/uL (ref 150.0–400.0)
RBC: 4.57 Mil/uL (ref 3.87–5.11)
RDW: 13.7 % (ref 11.5–15.5)
WBC: 5.8 10*3/uL (ref 4.0–10.5)

## 2023-03-01 LAB — COMPREHENSIVE METABOLIC PANEL
ALT: 22 U/L (ref 0–35)
AST: 22 U/L (ref 0–37)
Albumin: 4.2 g/dL (ref 3.5–5.2)
Alkaline Phosphatase: 52 U/L (ref 39–117)
BUN: 10 mg/dL (ref 6–23)
CO2: 28 mEq/L (ref 19–32)
Calcium: 9.2 mg/dL (ref 8.4–10.5)
Chloride: 105 mEq/L (ref 96–112)
Creatinine, Ser: 0.77 mg/dL (ref 0.40–1.20)
GFR: 92.14 mL/min (ref >60.00)
Glucose, Bld: 95 mg/dL (ref 70–99)
Potassium: 4.5 mEq/L (ref 3.5–5.1)
Sodium: 139 mEq/L (ref 135–145)
Total Bilirubin: 0.5 mg/dL (ref 0.2–1.2)
Total Protein: 7 g/dL (ref 6.0–8.3)

## 2023-03-01 MED ORDER — TRULANCE 3 MG PO TABS
3.0000 mg | ORAL_TABLET | Freq: Every day | ORAL | 5 refills | Status: DC
  Filled 2023-03-01 – 2023-04-08 (×2): qty 30, 30d supply, fill #0

## 2023-03-01 MED ORDER — TRETINOIN 0.05 % EX CREA: TOPICAL_CREAM | Freq: Every day | CUTANEOUS | 5 refills | Status: DC

## 2023-03-01 MED ORDER — LEVOTHYROXINE SODIUM 75 MCG PO TABS
75.0000 ug | ORAL_TABLET | Freq: Every day | ORAL | 3 refills | Status: DC
  Filled 2023-03-01 – 2023-04-01 (×2): qty 90, 90d supply, fill #0
  Filled 2023-06-26: qty 90, 90d supply, fill #1
  Filled 2023-09-24: qty 90, 90d supply, fill #2
  Filled 2023-12-23: qty 90, 90d supply, fill #3

## 2023-03-01 NOTE — Assessment & Plan Note (Addendum)
Chronic Amitiza not effective Linzess 290 mcg no longer effective-also taking MiraLAX daily with it Not controlled Trulance 3 mg daily

## 2023-03-01 NOTE — Assessment & Plan Note (Signed)
Chronic  Clinically euthyroid Currently taking levothyroxine 75 mcg daily Check tsh  Titrate med dose if needed  

## 2023-03-01 NOTE — Assessment & Plan Note (Signed)
Chronic Management per neurology Doing Botox injections Maxalt as needed

## 2023-03-01 NOTE — Assessment & Plan Note (Signed)
Chronic Taking vitamin D daily Check vitamin D level  

## 2023-03-06 ENCOUNTER — Other Ambulatory Visit (HOSPITAL_COMMUNITY): Payer: Self-pay

## 2023-03-07 ENCOUNTER — Other Ambulatory Visit (HOSPITAL_COMMUNITY): Payer: Self-pay

## 2023-03-15 ENCOUNTER — Other Ambulatory Visit: Payer: Self-pay

## 2023-03-15 ENCOUNTER — Other Ambulatory Visit (HOSPITAL_COMMUNITY): Payer: Self-pay

## 2023-03-21 ENCOUNTER — Telehealth: Payer: Self-pay

## 2023-03-21 NOTE — Telephone Encounter (Signed)
Pharmacy Patient Advocate Encounter  Received notification from [employer] that the request for prior authorization for Trulance has been denied due to .   I have submitted an E-appeal as pt has been on Linzess since 2014 and had a trial/failure of Amitiza in 2014

## 2023-04-01 ENCOUNTER — Other Ambulatory Visit: Payer: Self-pay

## 2023-04-01 ENCOUNTER — Other Ambulatory Visit (HOSPITAL_COMMUNITY): Payer: Self-pay

## 2023-04-01 ENCOUNTER — Encounter: Payer: Self-pay | Admitting: Internal Medicine

## 2023-04-01 MED ORDER — LINACLOTIDE 290 MCG PO CAPS
290.0000 ug | ORAL_CAPSULE | Freq: Every day | ORAL | 3 refills | Status: DC
  Filled 2023-04-01: qty 90, 90d supply, fill #0
  Filled 2023-06-26: qty 90, 90d supply, fill #1
  Filled 2023-09-24: qty 90, 90d supply, fill #2
  Filled 2023-12-23: qty 90, 90d supply, fill #3

## 2023-04-02 ENCOUNTER — Ambulatory Visit: Payer: Commercial Managed Care - PPO | Admitting: Neurology

## 2023-04-02 DIAGNOSIS — G43709 Chronic migraine without aura, not intractable, without status migrainosus: Secondary | ICD-10-CM

## 2023-04-02 MED ORDER — ONABOTULINUMTOXINA 200 UNITS IJ SOLR
155.0000 [IU] | Freq: Once | INTRAMUSCULAR | Status: AC
  Administered 2023-04-02: 155 [IU] via INTRAMUSCULAR

## 2023-04-02 NOTE — Progress Notes (Signed)
Patient signed consent Botox- 200 units x 1 vial Lot: [id number] Expiration: 05/2025 NDC: 4540-9811-91  Bacteriostatic 0.9% Sodium Chloride- 4 mL total Lot: [id number] Expiration: 10/2024 NDC: 21308-657-84  Dx: O96.295 S/P  Witnessed by April J RN

## 2023-04-02 NOTE — Progress Notes (Deleted)
Patient signed consent Botox- 200 units x 1 vial Lot: [id number] Expiration: 05/2025 NDC: 9811-9147-82  Bacteriostatic 0.9% Sodium Chloride- 4 mL total Lot: [id number] Expiration: 10/2024 NDC: 86578-469-62  Dx: X52.841 S/P  Witnessed by Leeann Must RN

## 2023-04-04 ENCOUNTER — Other Ambulatory Visit: Payer: Self-pay

## 2023-04-05 NOTE — Progress Notes (Signed)
Consent Form Botulism Toxin Injection For Chronic Migraine   04/02/2023: stable 11/01/2022: stable 07/03/2022: stable 02/16/2022: Stable >80% improvement in migraine frequency and severity.  Reviewed orally with patient, additionally signature is on file:  Botulism toxin has been approved by the Federal drug administration for treatment of chronic migraine. Botulism toxin does not cure chronic migraine and it may not be effective in some patients.  The administration of botulism toxin is accomplished by injecting a small amount of toxin into the muscles of the neck and head. Dosage must be titrated for each individual. Any benefits resulting from botulism toxin tend to wear off after 3 months with a repeat injection required if benefit is to be maintained. Injections are usually done every 3-4 months with maximum effect peak achieved by about 2 or 3 weeks. Botulism toxin is expensive and you should be sure of what costs you will incur resulting from the injection.  The side effects of botulism toxin use for chronic migraine may include:   -Transient, and usually mild, facial weakness with facial injections  -Transient, and usually mild, head or neck weakness with head/neck injections  -Reduction or loss of forehead facial animation due to forehead muscle weakness  -Eyelid drooping  -Dry eye  -Pain at the site of injection or bruising at the site of injection  -Double vision  -Potential unknown long term risks  Contraindications: You should not have Botox if you are pregnant, nursing, allergic to albumin, have an infection, skin condition, or muscle weakness at the site of the injection, or have myasthenia gravis, Lambert-Eaton syndrome, or ALS.  It is also possible that as with any injection, there may be an allergic reaction or no effect from the medication. Reduced effectiveness after repeated injections is sometimes seen and rarely infection at the injection site may occur. All care will  be taken to prevent these side effects. If therapy is given over a long time, atrophy and wasting in the muscle injected may occur. Occasionally the patient's become refractory to treatment because they develop antibodies to the toxin. In this event, therapy needs to be modified.  I have read the above information and consent to the administration of botulism toxin.    BOTOX PROCEDURE NOTE FOR MIGRAINE HEADACHE    Contraindications and precautions discussed with patient(above). Aseptic procedure was observed and patient tolerated procedure. Procedure performed by Dr. Artemio Aly  The condition has existed for more than 6 months, and pt does not have a diagnosis of ALS, Myasthenia Gravis or Lambert-Eaton Syndrome.  Risks and benefits of injections discussed and pt agrees to proceed with the procedure.  Written consent obtained  These injections are medically necessary. Pt  receives good benefits from these injections. These injections do not cause sedations or hallucinations which the oral therapies may cause.  Description of procedure:  The patient was placed in a sitting position. The standard protocol was used for Botox as follows, with 5 units of Botox injected at each site:   -Procerus muscle, midline injection  -Corrugator muscle, bilateral injection  -Frontalis muscle, bilateral injection, with 2 sites each side, medial injection was performed in the upper one third of the frontalis muscle, in the region vertical from the medial inferior edge of the superior orbital rim. The lateral injection was again in the upper one third of the forehead vertically above the lateral limbus of the cornea, 1.5 cm lateral to the medial injection site.  -Temporalis muscle injection, 4 sites, bilaterally. The first injection was 3  cm above the tragus of the ear, second injection site was 1.5 cm to 3 cm up from the first injection site in line with the tragus of the ear. The third injection site was  1.5-3 cm forward between the first 2 injection sites. The fourth injection site was 1.5 cm posterior to the second injection site.   -Occipitalis muscle injection, 3 sites, bilaterally. The first injection was done one half way between the occipital protuberance and the tip of the mastoid process behind the ear. The second injection site was done lateral and superior to the first, 1 fingerbreadth from the first injection. The third injection site was 1 fingerbreadth superiorly and medially from the first injection site.  -Cervical paraspinal muscle injection, 2 sites, bilateral knee first injection site was 1 cm from the midline of the cervical spine, 3 cm inferior to the lower border of the occipital protuberance. The second injection site was 1.5 cm superiorly and laterally to the first injection site.  -Trapezius muscle injection was performed at 3 sites, bilaterally. The first injection site was in the upper trapezius muscle halfway between the inflection point of the neck, and the acromion. The second injection site was one half way between the acromion and the first injection site. The third injection was done between the first injection site and the inflection point of the neck.   Will return for repeat injection in 3 months.   155 units of Botox was used, 45 Botox not injected was wasted. The patient tolerated the procedure well, there were no complications of the above procedure.

## 2023-04-08 ENCOUNTER — Other Ambulatory Visit (HOSPITAL_COMMUNITY): Payer: Self-pay

## 2023-04-08 NOTE — Telephone Encounter (Signed)
[facility] approved and my-chart message sent to patient.

## 2023-04-12 ENCOUNTER — Other Ambulatory Visit (HOSPITAL_COMMUNITY): Payer: Self-pay

## 2023-04-26 ENCOUNTER — Other Ambulatory Visit: Payer: Self-pay | Admitting: Neurology

## 2023-04-26 MED ORDER — RIZATRIPTAN BENZOATE 10 MG PO TBDP: 10.0000 mg | ORAL_TABLET | ORAL | 12 refills | Status: AC | PRN

## 2023-05-06 ENCOUNTER — Other Ambulatory Visit (HOSPITAL_COMMUNITY): Payer: Self-pay

## 2023-05-08 ENCOUNTER — Other Ambulatory Visit (HOSPITAL_COMMUNITY): Payer: Self-pay

## 2023-05-08 MED ORDER — TACROLIMUS 0.1 % EX OINT
TOPICAL_OINTMENT | Freq: Two times a day (BID) | CUTANEOUS | 2 refills | Status: AC | PRN
  Filled 2023-05-08: qty 30, 15d supply, fill #0
  Filled 2024-03-19: qty 30, 30d supply, fill #1

## 2023-05-09 ENCOUNTER — Other Ambulatory Visit (HOSPITAL_COMMUNITY): Payer: Self-pay

## 2023-06-12 ENCOUNTER — Other Ambulatory Visit (HOSPITAL_COMMUNITY): Payer: Self-pay

## 2023-06-14 ENCOUNTER — Other Ambulatory Visit (HOSPITAL_COMMUNITY): Payer: Self-pay

## 2023-06-26 ENCOUNTER — Ambulatory Visit: Payer: Commercial Managed Care - PPO | Admitting: Neurology

## 2023-06-26 DIAGNOSIS — G43709 Chronic migraine without aura, not intractable, without status migrainosus: Secondary | ICD-10-CM | POA: Diagnosis not present

## 2023-06-26 MED ORDER — ONABOTULINUMTOXINA 200 UNITS IJ SOLR
155.0000 [IU] | Freq: Once | INTRAMUSCULAR | Status: AC
  Administered 2023-06-26: 155 [IU] via INTRAMUSCULAR

## 2023-06-26 NOTE — Progress Notes (Signed)
Botox- 200 units x 1 vial Lot: [id number] Expiration: 06/2025 NDC: 4782-9562-13   Bacteriostatic 0.9% Sodium Chloride- 4 mL total Lot: [id number] Expiration: 10/2024 NDC: 69629-528-41   Dx: L24.401 SP  Witnessed by Leeann Must

## 2023-06-26 NOTE — Progress Notes (Signed)
Consent Form Botulism Toxin Injection For Chronic Migraine   06/26/2023: Stable >80% improvement in migraine frequency and severity. 04/02/2023: stable 11/01/2022: stable 07/03/2022: stable 02/16/2022: Stable >80% improvement in migraine frequency and severity.  Reviewed orally with patient, additionally signature is on file:  Botulism toxin has been approved by the Federal drug administration for treatment of chronic migraine. Botulism toxin does not cure chronic migraine and it may not be effective in some patients.  The administration of botulism toxin is accomplished by injecting a small amount of toxin into the muscles of the neck and head. Dosage must be titrated for each individual. Any benefits resulting from botulism toxin tend to wear off after 3 months with a repeat injection required if benefit is to be maintained. Injections are usually done every 3-4 months with maximum effect peak achieved by about 2 or 3 weeks. Botulism toxin is expensive and you should be sure of what costs you will incur resulting from the injection.  The side effects of botulism toxin use for chronic migraine may include:   -Transient, and usually mild, facial weakness with facial injections  -Transient, and usually mild, head or neck weakness with head/neck injections  -Reduction or loss of forehead facial animation due to forehead muscle weakness  -Eyelid drooping  -Dry eye  -Pain at the site of injection or bruising at the site of injection  -Double vision  -Potential unknown long term risks  Contraindications: You should not have Botox if you are pregnant, nursing, allergic to albumin, have an infection, skin condition, or muscle weakness at the site of the injection, or have myasthenia gravis, Lambert-Eaton syndrome, or ALS.  It is also possible that as with any injection, there may be an allergic reaction or no effect from the medication. Reduced effectiveness after repeated injections is sometimes  seen and rarely infection at the injection site may occur. All care will be taken to prevent these side effects. If therapy is given over a long time, atrophy and wasting in the muscle injected may occur. Occasionally the patient's become refractory to treatment because they develop antibodies to the toxin. In this event, therapy needs to be modified.  I have read the above information and consent to the administration of botulism toxin.    BOTOX PROCEDURE NOTE FOR MIGRAINE HEADACHE    Contraindications and precautions discussed with patient(above). Aseptic procedure was observed and patient tolerated procedure. Procedure performed by Dr. Artemio Aly  The condition has existed for more than 6 months, and pt does not have a diagnosis of ALS, Myasthenia Gravis or Lambert-Eaton Syndrome.  Risks and benefits of injections discussed and pt agrees to proceed with the procedure.  Written consent obtained  These injections are medically necessary. Pt  receives good benefits from these injections. These injections do not cause sedations or hallucinations which the oral therapies may cause.  Description of procedure:  The patient was placed in a sitting position. The standard protocol was used for Botox as follows, with 5 units of Botox injected at each site:   -Procerus muscle, midline injection  -Corrugator muscle, bilateral injection  -Frontalis muscle, bilateral injection, with 2 sites each side, medial injection was performed in the upper one third of the frontalis muscle, in the region vertical from the medial inferior edge of the superior orbital rim. The lateral injection was again in the upper one third of the forehead vertically above the lateral limbus of the cornea, 1.5 cm lateral to the medial injection site.  -Temporalis muscle  injection, 4 sites, bilaterally. The first injection was 3 cm above the tragus of the ear, second injection site was 1.5 cm to 3 cm up from the first injection  site in line with the tragus of the ear. The third injection site was 1.5-3 cm forward between the first 2 injection sites. The fourth injection site was 1.5 cm posterior to the second injection site.   -Occipitalis muscle injection, 3 sites, bilaterally. The first injection was done one half way between the occipital protuberance and the tip of the mastoid process behind the ear. The second injection site was done lateral and superior to the first, 1 fingerbreadth from the first injection. The third injection site was 1 fingerbreadth superiorly and medially from the first injection site.  -Cervical paraspinal muscle injection, 2 sites, bilateral knee first injection site was 1 cm from the midline of the cervical spine, 3 cm inferior to the lower border of the occipital protuberance. The second injection site was 1.5 cm superiorly and laterally to the first injection site.  -Trapezius muscle injection was performed at 3 sites, bilaterally. The first injection site was in the upper trapezius muscle halfway between the inflection point of the neck, and the acromion. The second injection site was one half way between the acromion and the first injection site. The third injection was done between the first injection site and the inflection point of the neck.   Will return for repeat injection in 3 months.   155 units of Botox was used, 45 Botox not injected was wasted. The patient tolerated the procedure well, there were no complications of the above procedure.

## 2023-09-03 ENCOUNTER — Other Ambulatory Visit (HOSPITAL_COMMUNITY): Payer: Self-pay

## 2023-09-04 ENCOUNTER — Other Ambulatory Visit (HOSPITAL_COMMUNITY): Payer: Self-pay

## 2023-09-18 ENCOUNTER — Other Ambulatory Visit: Payer: Self-pay | Admitting: Internal Medicine

## 2023-09-18 ENCOUNTER — Ambulatory Visit: Payer: Commercial Managed Care - PPO | Admitting: Neurology

## 2023-09-18 DIAGNOSIS — Z1231 Encounter for screening mammogram for malignant neoplasm of breast: Secondary | ICD-10-CM

## 2023-09-18 DIAGNOSIS — G43709 Chronic migraine without aura, not intractable, without status migrainosus: Secondary | ICD-10-CM | POA: Diagnosis not present

## 2023-09-18 MED ORDER — ONABOTULINUMTOXINA 200 UNITS IJ SOLR
155.0000 [IU] | Freq: Once | INTRAMUSCULAR | Status: AC
  Administered 2023-09-18: 155 [IU] via INTRAMUSCULAR

## 2023-09-18 NOTE — Progress Notes (Signed)
Botox- 200 units x 1 vial Lot: [id number]  Expiration: 11/2025 NDC: 5409-8119-14  Bacteriostatic 0.9% Sodium Chloride-  4 mL  Lot: [id number] Expiration: 03/17/2024 NDC: 2130-8657-84  Dx: O96.295 S/P  Witnessed by Delmer Islam

## 2023-09-18 NOTE — Progress Notes (Signed)
Consent Form Botulism Toxin Injection For Chronic Migraine   09/18/2023: stable 06/26/2023: Stable >80% improvement in migraine frequency and severity. 04/02/2023: stable 11/01/2022: stable 07/03/2022: stable 02/16/2022: Stable >80% improvement in migraine frequency and severity.  Reviewed orally with patient, additionally signature is on file:  Botulism toxin has been approved by the Federal drug administration for treatment of chronic migraine. Botulism toxin does not cure chronic migraine and it may not be effective in some patients.  The administration of botulism toxin is accomplished by injecting a small amount of toxin into the muscles of the neck and head. Dosage must be titrated for each individual. Any benefits resulting from botulism toxin tend to wear off after 3 months with a repeat injection required if benefit is to be maintained. Injections are usually done every 3-4 months with maximum effect peak achieved by about 2 or 3 weeks. Botulism toxin is expensive and you should be sure of what costs you will incur resulting from the injection.  The side effects of botulism toxin use for chronic migraine may include:   -Transient, and usually mild, facial weakness with facial injections  -Transient, and usually mild, head or neck weakness with head/neck injections  -Reduction or loss of forehead facial animation due to forehead muscle weakness  -Eyelid drooping  -Dry eye  -Pain at the site of injection or bruising at the site of injection  -Double vision  -Potential unknown long term risks  Contraindications: You should not have Botox if you are pregnant, nursing, allergic to albumin, have an infection, skin condition, or muscle weakness at the site of the injection, or have myasthenia gravis, Lambert-Eaton syndrome, or ALS.  It is also possible that as with any injection, there may be an allergic reaction or no effect from the medication. Reduced effectiveness after repeated  injections is sometimes seen and rarely infection at the injection site may occur. All care will be taken to prevent these side effects. If therapy is given over a long time, atrophy and wasting in the muscle injected may occur. Occasionally the patient's become refractory to treatment because they develop antibodies to the toxin. In this event, therapy needs to be modified.  I have read the above information and consent to the administration of botulism toxin.    BOTOX PROCEDURE NOTE FOR MIGRAINE HEADACHE    Contraindications and precautions discussed with patient(above). Aseptic procedure was observed and patient tolerated procedure. Procedure performed by Dr. Artemio Aly  The condition has existed for more than 6 months, and pt does not have a diagnosis of ALS, Myasthenia Gravis or Lambert-Eaton Syndrome.  Risks and benefits of injections discussed and pt agrees to proceed with the procedure.  Written consent obtained  These injections are medically necessary. Pt  receives good benefits from these injections. These injections do not cause sedations or hallucinations which the oral therapies may cause.  Description of procedure:  The patient was placed in a sitting position. The standard protocol was used for Botox as follows, with 5 units of Botox injected at each site:   -Procerus muscle, midline injection  -Corrugator muscle, bilateral injection  -Frontalis muscle, bilateral injection, with 2 sites each side, medial injection was performed in the upper one third of the frontalis muscle, in the region vertical from the medial inferior edge of the superior orbital rim. The lateral injection was again in the upper one third of the forehead vertically above the lateral limbus of the cornea, 1.5 cm lateral to the medial injection site.  -  Temporalis muscle injection, 4 sites, bilaterally. The first injection was 3 cm above the tragus of the ear, second injection site was 1.5 cm to 3 cm up  from the first injection site in line with the tragus of the ear. The third injection site was 1.5-3 cm forward between the first 2 injection sites. The fourth injection site was 1.5 cm posterior to the second injection site.   -Occipitalis muscle injection, 3 sites, bilaterally. The first injection was done one half way between the occipital protuberance and the tip of the mastoid process behind the ear. The second injection site was done lateral and superior to the first, 1 fingerbreadth from the first injection. The third injection site was 1 fingerbreadth superiorly and medially from the first injection site.  -Cervical paraspinal muscle injection, 2 sites, bilateral knee first injection site was 1 cm from the midline of the cervical spine, 3 cm inferior to the lower border of the occipital protuberance. The second injection site was 1.5 cm superiorly and laterally to the first injection site.  -Trapezius muscle injection was performed at 3 sites, bilaterally. The first injection site was in the upper trapezius muscle halfway between the inflection point of the neck, and the acromion. The second injection site was one half way between the acromion and the first injection site. The third injection was done between the first injection site and the inflection point of the neck.   Will return for repeat injection in 3 months.   155 units of Botox was used, 45 Botox not injected was wasted. The patient tolerated the procedure well, there were no complications of the above procedure.

## 2023-09-24 ENCOUNTER — Other Ambulatory Visit (HOSPITAL_COMMUNITY): Payer: Self-pay

## 2023-09-25 ENCOUNTER — Other Ambulatory Visit (HOSPITAL_COMMUNITY): Payer: Self-pay

## 2023-10-23 ENCOUNTER — Other Ambulatory Visit: Payer: Self-pay

## 2023-11-04 ENCOUNTER — Ambulatory Visit
Admission: RE | Admit: 2023-11-04 | Discharge: 2023-11-04 | Disposition: A | Discharge status: Home / Self Care | Payer: Commercial Managed Care - PPO | Source: Ambulatory Visit | Attending: Internal Medicine | Admitting: Internal Medicine

## 2023-11-04 DIAGNOSIS — Z1231 Encounter for screening mammogram for malignant neoplasm of breast: Secondary | ICD-10-CM | POA: Diagnosis not present

## 2023-11-06 ENCOUNTER — Other Ambulatory Visit: Payer: Self-pay | Admitting: Internal Medicine

## 2023-11-06 DIAGNOSIS — R928 Other abnormal and inconclusive findings on diagnostic imaging of breast: Secondary | ICD-10-CM

## 2023-11-17 HISTORY — PX: BREAST CYST ASPIRATION: SHX578

## 2023-11-20 ENCOUNTER — Ambulatory Visit
Admission: RE | Admit: 2023-11-20 | Discharge: 2023-11-20 | Disposition: A | Discharge status: Home / Self Care | Payer: Commercial Managed Care - PPO | Source: Ambulatory Visit | Attending: Internal Medicine

## 2023-11-20 ENCOUNTER — Other Ambulatory Visit: Payer: Self-pay | Admitting: Internal Medicine

## 2023-11-20 DIAGNOSIS — R928 Other abnormal and inconclusive findings on diagnostic imaging of breast: Secondary | ICD-10-CM

## 2023-11-20 DIAGNOSIS — N6002 Solitary cyst of left breast: Secondary | ICD-10-CM

## 2023-11-20 DIAGNOSIS — N6325 Unspecified lump in the left breast, overlapping quadrants: Secondary | ICD-10-CM | POA: Diagnosis not present

## 2023-11-21 ENCOUNTER — Other Ambulatory Visit (HOSPITAL_COMMUNITY)
Admission: RE | Admit: 2023-11-21 | Discharge: 2023-11-21 | Disposition: A | Discharge status: Home / Self Care | Payer: Commercial Managed Care - PPO | Source: Ambulatory Visit | Attending: Diagnostic Radiology | Admitting: Diagnostic Radiology

## 2023-11-21 ENCOUNTER — Ambulatory Visit
Admission: RE | Admit: 2023-11-21 | Discharge: 2023-11-21 | Disposition: A | Discharge status: Home / Self Care | Payer: Commercial Managed Care - PPO | Source: Ambulatory Visit | Attending: Internal Medicine | Admitting: Internal Medicine

## 2023-11-21 DIAGNOSIS — N6002 Solitary cyst of left breast: Secondary | ICD-10-CM

## 2023-11-21 DIAGNOSIS — N6325 Unspecified lump in the left breast, overlapping quadrants: Secondary | ICD-10-CM | POA: Diagnosis not present

## 2023-11-21 DIAGNOSIS — R896 Abnormal cytological findings in specimens from other organs, systems and tissues: Secondary | ICD-10-CM | POA: Diagnosis not present

## 2023-11-25 ENCOUNTER — Other Ambulatory Visit: Payer: Commercial Managed Care - PPO

## 2023-11-26 LAB — CYTOLOGY - NON PAP

## 2023-11-27 ENCOUNTER — Other Ambulatory Visit: Payer: Commercial Managed Care - PPO

## 2023-11-29 ENCOUNTER — Other Ambulatory Visit: Payer: Self-pay | Admitting: General Surgery

## 2023-11-29 ENCOUNTER — Encounter: Payer: Self-pay | Admitting: General Surgery

## 2023-11-29 ENCOUNTER — Encounter: Payer: Self-pay | Admitting: Internal Medicine

## 2023-11-29 DIAGNOSIS — N6322 Unspecified lump in the left breast, upper inner quadrant: Secondary | ICD-10-CM

## 2023-11-29 DIAGNOSIS — R897 Abnormal histological findings in specimens from other organs, systems and tissues: Secondary | ICD-10-CM

## 2023-11-29 DIAGNOSIS — N6320 Unspecified lump in the left breast, unspecified quadrant: Secondary | ICD-10-CM

## 2023-11-29 DIAGNOSIS — R928 Other abnormal and inconclusive findings on diagnostic imaging of breast: Secondary | ICD-10-CM

## 2023-12-03 ENCOUNTER — Encounter: Payer: Self-pay | Admitting: Internal Medicine

## 2023-12-04 ENCOUNTER — Ambulatory Visit
Admission: RE | Admit: 2023-12-04 | Discharge: 2023-12-04 | Disposition: A | Discharge status: Home / Self Care | Payer: Commercial Managed Care - PPO | Source: Ambulatory Visit | Attending: Internal Medicine | Admitting: Internal Medicine

## 2023-12-04 ENCOUNTER — Other Ambulatory Visit: Payer: Self-pay | Admitting: General Surgery

## 2023-12-04 DIAGNOSIS — N6002 Solitary cyst of left breast: Secondary | ICD-10-CM | POA: Diagnosis not present

## 2023-12-04 DIAGNOSIS — R897 Abnormal histological findings in specimens from other organs, systems and tissues: Secondary | ICD-10-CM

## 2023-12-04 DIAGNOSIS — R928 Other abnormal and inconclusive findings on diagnostic imaging of breast: Secondary | ICD-10-CM

## 2023-12-04 DIAGNOSIS — N6322 Unspecified lump in the left breast, upper inner quadrant: Secondary | ICD-10-CM

## 2023-12-04 DIAGNOSIS — N6311 Unspecified lump in the right breast, upper outer quadrant: Secondary | ICD-10-CM | POA: Diagnosis not present

## 2023-12-04 MED ORDER — GADOPICLENOL 0.5 MMOL/ML IV SOLN
6.0000 mL | Freq: Once | INTRAVENOUS | Status: AC | PRN
  Administered 2023-12-04: 6 mL via INTRAVENOUS

## 2023-12-05 ENCOUNTER — Encounter (HOSPITAL_COMMUNITY)
Admission: RE | Admit: 2023-12-05 | Discharge: 2023-12-05 | Disposition: A | Discharge status: Home / Self Care | Payer: Commercial Managed Care - PPO | Source: Ambulatory Visit | Attending: General Surgery | Admitting: General Surgery

## 2023-12-05 ENCOUNTER — Ambulatory Visit
Admission: RE | Admit: 2023-12-05 | Discharge: 2023-12-05 | Disposition: A | Discharge status: Home / Self Care | Payer: Commercial Managed Care - PPO | Source: Ambulatory Visit | Attending: General Surgery | Admitting: General Surgery

## 2023-12-05 ENCOUNTER — Other Ambulatory Visit: Payer: Self-pay | Admitting: General Surgery

## 2023-12-05 ENCOUNTER — Encounter (HOSPITAL_COMMUNITY): Payer: Self-pay | Admitting: General Surgery

## 2023-12-05 ENCOUNTER — Other Ambulatory Visit: Payer: Self-pay

## 2023-12-05 DIAGNOSIS — Z01812 Encounter for preprocedural laboratory examination: Secondary | ICD-10-CM | POA: Diagnosis not present

## 2023-12-05 DIAGNOSIS — R9389 Abnormal findings on diagnostic imaging of other specified body structures: Secondary | ICD-10-CM

## 2023-12-05 DIAGNOSIS — N6310 Unspecified lump in the right breast, unspecified quadrant: Secondary | ICD-10-CM

## 2023-12-05 DIAGNOSIS — N6322 Unspecified lump in the left breast, upper inner quadrant: Secondary | ICD-10-CM

## 2023-12-05 DIAGNOSIS — N6320 Unspecified lump in the left breast, unspecified quadrant: Secondary | ICD-10-CM

## 2023-12-05 DIAGNOSIS — N6011 Diffuse cystic mastopathy of right breast: Secondary | ICD-10-CM | POA: Diagnosis not present

## 2023-12-05 DIAGNOSIS — N6311 Unspecified lump in the right breast, upper outer quadrant: Secondary | ICD-10-CM | POA: Diagnosis not present

## 2023-12-05 DIAGNOSIS — D241 Benign neoplasm of right breast: Secondary | ICD-10-CM | POA: Diagnosis not present

## 2023-12-05 HISTORY — PX: BREAST BIOPSY: SHX20

## 2023-12-05 LAB — POCT PREGNANCY, URINE: Preg Test, Ur: NEGATIVE

## 2023-12-05 NOTE — Progress Notes (Signed)
Pt came and got CHG soap and instructions from PAT

## 2023-12-05 NOTE — Pre-Procedure Instructions (Signed)
PCP - Dr. Cheryll Cockayne Cardiologist - denies  PPM/ICD - denies   Chest x-ray - denies EKG - denies Stress Test - denies ECHO - denies Cardiac Cath - denies  CPAP - denies  DM- denies  ASA/Blood Thinner Instructions: n/a   ERAS Protcol - clears until 0430  COVID TEST- n/a  Anesthesia review: no  Patient verbally denies any shortness of breath, fever, cough and chest pain during phone call   -------------  SDW INSTRUCTIONS given:  Your procedure is scheduled on 12/20.  Report to [facility] Main Entrance "A" at 0530 A.M., and check in at the Admitting office.  Call this number if you have problems the morning of surgery:  [phone]   Remember:  Do not eat after midnight the night before your surgery  You may drink clear liquids until 0430 AM the morning of your surgery.   Clear liquids allowed are: Water, Non-Citrus Juices (without pulp), Carbonated Beverages, Clear Tea, Black Coffee Only, and Gatorade    Take these medicines the morning of surgery with A SIP OF WATER  Levothyroxine Claritin PRN Maxalt PRN   As of today, STOP taking any Aspirin (unless otherwise instructed by your surgeon) Aleve, Naproxen, Ibuprofen, Motrin, Advil, Goody's, BC's, all herbal medications, fish oil, and all vitamins.                      Do not wear jewelry, make up, or nail polish            Do not wear lotions, powders, perfumes/colognes, or deodorant.            Do not shave 48 hours prior to surgery.  Men may shave face and neck.            Do not bring valuables to the hospital.            [facility] is not responsible for any belongings or valuables.  Do NOT Smoke (Tobacco/Vaping) 24 hours prior to your procedure If you use a CPAP at night, you may bring all equipment for your overnight stay.   Contacts, glasses, dentures or bridgework may not be worn into surgery.      For patients admitted to the hospital, discharge time will be determined by your treatment team.    Patients discharged the day of surgery will not be allowed to drive home, and someone needs to stay with them for 24 hours.    Special instructions:   [facility]- Preparing For Surgery  Before surgery, you can play an important role. Because skin is not sterile, your skin needs to be as free of germs as possible. You can reduce the number of germs on your skin by washing with CHG (chlorahexidine gluconate) Soap before surgery.  CHG is an antiseptic cleaner which kills germs and bonds with the skin to continue killing germs even after washing.    Oral Hygiene is also important to reduce your risk of infection.  Remember - BRUSH YOUR TEETH THE MORNING OF SURGERY WITH YOUR REGULAR TOOTHPASTE  Please do not use if you have an allergy to CHG or antibacterial soaps. If your skin becomes reddened/irritated stop using the CHG.  Do not shave (including legs and underarms) for at least 48 hours prior to first CHG shower. It is OK to shave your face.  Please follow these instructions carefully.   Shower the NIGHT BEFORE SURGERY and the MORNING OF SURGERY with DIAL Soap.   Pat yourself  dry with a CLEAN TOWEL.  Wear CLEAN PAJAMAS to bed the night before surgery  Place CLEAN SHEETS on your bed the night of your first shower and DO NOT SLEEP WITH PETS.   Day of Surgery: Please shower morning of surgery  Wear Clean/Comfortable clothing the morning of surgery Do not apply any deodorants/lotions.   Remember to brush your teeth WITH YOUR REGULAR TOOTHPASTE.   Questions were answered. Patient verbalized understanding of instructions.

## 2023-12-06 ENCOUNTER — Ambulatory Visit (HOSPITAL_COMMUNITY)
Admission: RE | Admit: 2023-12-06 | Payer: Commercial Managed Care - PPO | Source: Home / Self Care | Admitting: General Surgery

## 2023-12-06 LAB — SURGICAL PATHOLOGY

## 2023-12-06 SURGERY — RADIOACTIVE SEED GUIDED EXCISIONAL BREAST BIOPSY
Anesthesia: General | Laterality: Left

## 2023-12-17 ENCOUNTER — Other Ambulatory Visit: Payer: Self-pay

## 2023-12-17 NOTE — Progress Notes (Signed)
 Specialty Pharmacy Refill Coordination Note  Cynthia Ramsey is a 47 y.o. female contacted today regarding refills of specialty medication(s) OnabotulinumtoxinA  (BOTOX )   Patient requested Courier to Provider Office   Delivery date: 12/24/23   Verified address: [facility]  [address]  [place] [address]   Medication will be filled on 12/23/23.

## 2023-12-18 HISTORY — PX: BREAST EXCISIONAL BIOPSY: SUR124

## 2023-12-30 ENCOUNTER — Ambulatory Visit: Payer: Commercial Managed Care - PPO | Admitting: Neurology

## 2023-12-30 DIAGNOSIS — G43709 Chronic migraine without aura, not intractable, without status migrainosus: Secondary | ICD-10-CM | POA: Diagnosis not present

## 2023-12-30 MED ORDER — ONABOTULINUMTOXINA 200 UNITS IJ SOLR
155.0000 [IU] | Freq: Once | INTRAMUSCULAR | Status: AC
  Administered 2023-12-30: 155 [IU] via INTRAMUSCULAR

## 2023-12-30 NOTE — Progress Notes (Signed)
 Botox- 200 units x 1 vial Lot: [id number] Expiration: 02/2026 NDC: 4098-1191-47  Bacteriostatic 0.9% Sodium Chloride- 4 mL  Lot: [id number] Expiration: 03/17/2024 NDC: 1308-6578-46  Dx: N62.952 S/P Witnessed by Jake Seats. RN

## 2024-01-01 NOTE — Progress Notes (Signed)
 Consent Form Botulism Toxin Injection For Chronic Migraine   12/30/2023: >80% improvement in migraine frequency and severity.Patient feels that her migraines cause her jaw to ache in her jaw aching also can cause her migraines to worsen it is a trigger for migraines included 5 units in each masseter to see if that helps with migraine severity.  09/18/2023: stable 06/26/2023: Stable >80% improvement in migraine frequency and severity. 04/02/2023: stable 11/01/2022: stable 07/03/2022: stable 02/16/2022: Stable >80% improvement in migraine frequency and severity.  Reviewed orally with patient, additionally signature is on file:  Botulism toxin has been approved by the Federal drug administration for treatment of chronic migraine. Botulism toxin does not cure chronic migraine and it may not be effective in some patients.  The administration of botulism toxin is accomplished by injecting a small amount of toxin into the muscles of the neck and head. Dosage must be titrated for each individual. Any benefits resulting from botulism toxin tend to wear off after 3 months with a repeat injection required if benefit is to be maintained. Injections are usually done every 3-4 months with maximum effect peak achieved by about 2 or 3 weeks. Botulism toxin is expensive and you should be sure of what costs you will incur resulting from the injection.  The side effects of botulism toxin use for chronic migraine may include:   -Transient, and usually mild, facial weakness with facial injections  -Transient, and usually mild, head or neck weakness with head/neck injections  -Reduction or loss of forehead facial animation due to forehead muscle weakness  -Eyelid drooping  -Dry eye  -Pain at the site of injection or bruising at the site of injection  -Double vision  -Potential unknown long term risks  Contraindications: You should not have Botox  if you are pregnant, nursing, allergic to albumin, have an  infection, skin condition, or muscle weakness at the site of the injection, or have myasthenia gravis, Lambert-Eaton syndrome, or ALS.  It is also possible that as with any injection, there may be an allergic reaction or no effect from the medication. Reduced effectiveness after repeated injections is sometimes seen and rarely infection at the injection site may occur. All care will be taken to prevent these side effects. If therapy is given over a long time, atrophy and wasting in the muscle injected may occur. Occasionally the patient's become refractory to treatment because they develop antibodies to the toxin. In this event, therapy needs to be modified.  I have read the above information and consent to the administration of botulism toxin.    BOTOX  PROCEDURE NOTE FOR MIGRAINE HEADACHE    Contraindications and precautions discussed with patient(above). Aseptic procedure was observed and patient tolerated procedure. Procedure performed by Dr. Andree Epp  The condition has existed for more than 6 months, and pt does not have a diagnosis of ALS, Myasthenia Gravis or Lambert-Eaton Syndrome.  Risks and benefits of injections discussed and pt agrees to proceed with the procedure.  Written consent obtained  These injections are medically necessary. Pt  receives good benefits from these injections. These injections do not cause sedations or hallucinations which the oral therapies may cause.  Description of procedure:  The patient was placed in a sitting position. The standard protocol was used for Botox  as follows, with 5 units of Botox  injected at each site:   -Procerus muscle, midline injection  -Corrugator muscle, bilateral injection  -Frontalis muscle, bilateral injection, with 2 sites each side, medial injection was performed in the upper one  third of the frontalis muscle, in the region vertical from the medial inferior edge of the superior orbital rim. The lateral injection was again in  the upper one third of the forehead vertically above the lateral limbus of the cornea, 1.5 cm lateral to the medial injection site.  -Temporalis muscle injection, 4 sites, bilaterally. The first injection was 3 cm above the tragus of the ear, second injection site was 1.5 cm to 3 cm up from the first injection site in line with the tragus of the ear. The third injection site was 1.5-3 cm forward between the first 2 injection sites. The fourth injection site was 1.5 cm posterior to the second injection site.   -Occipitalis muscle injection, 3 sites, bilaterally. The first injection was done one half way between the occipital protuberance and the tip of the mastoid process behind the ear. The second injection site was done lateral and superior to the first, 1 fingerbreadth from the first injection. The third injection site was 1 fingerbreadth superiorly and medially from the first injection site.  -Cervical paraspinal muscle injection, 2 sites, bilateral knee first injection site was 1 cm from the midline of the cervical spine, 3 cm inferior to the lower border of the occipital protuberance. The second injection site was 1.5 cm superiorly and laterally to the first injection site.  -Trapezius muscle injection was performed at 3 sites, bilaterally. The first injection site was in the upper trapezius muscle halfway between the inflection point of the neck, and the acromion. The second injection site was one half way between the acromion and the first injection site. The third injection was done between the first injection site and the inflection point of the neck.   Will return for repeat injection in 3 months.   155 units of Botox  was used, 45 Botox  not injected was wasted. The patient tolerated the procedure well, there were no complications of the above procedure.

## 2024-01-02 ENCOUNTER — Encounter (HOSPITAL_BASED_OUTPATIENT_CLINIC_OR_DEPARTMENT_OTHER): Payer: Self-pay | Admitting: General Surgery

## 2024-01-02 ENCOUNTER — Other Ambulatory Visit: Payer: Self-pay

## 2024-01-09 ENCOUNTER — Ambulatory Visit
Admission: RE | Admit: 2024-01-09 | Discharge: 2024-01-09 | Disposition: A | Discharge status: Home / Self Care | Payer: Commercial Managed Care - PPO | Source: Ambulatory Visit | Attending: General Surgery | Admitting: General Surgery

## 2024-01-09 DIAGNOSIS — N6322 Unspecified lump in the left breast, upper inner quadrant: Secondary | ICD-10-CM

## 2024-01-09 DIAGNOSIS — N6002 Solitary cyst of left breast: Secondary | ICD-10-CM | POA: Diagnosis not present

## 2024-01-09 DIAGNOSIS — N6092 Unspecified benign mammary dysplasia of left breast: Secondary | ICD-10-CM | POA: Diagnosis not present

## 2024-01-09 HISTORY — PX: BREAST BIOPSY: SHX20

## 2024-01-10 ENCOUNTER — Other Ambulatory Visit: Payer: Self-pay

## 2024-01-10 ENCOUNTER — Encounter (HOSPITAL_BASED_OUTPATIENT_CLINIC_OR_DEPARTMENT_OTHER)
Admission: RE | Disposition: A | Discharge status: Home / Self Care | Payer: Self-pay | Source: Home / Self Care | Attending: General Surgery

## 2024-01-10 ENCOUNTER — Ambulatory Visit (HOSPITAL_BASED_OUTPATIENT_CLINIC_OR_DEPARTMENT_OTHER): Payer: Commercial Managed Care - PPO | Admitting: Anesthesiology

## 2024-01-10 ENCOUNTER — Ambulatory Visit
Admission: RE | Admit: 2024-01-10 | Discharge: 2024-01-10 | Disposition: A | Discharge status: Home / Self Care | Payer: Commercial Managed Care - PPO | Source: Ambulatory Visit | Attending: General Surgery | Admitting: General Surgery

## 2024-01-10 ENCOUNTER — Ambulatory Visit (HOSPITAL_BASED_OUTPATIENT_CLINIC_OR_DEPARTMENT_OTHER)
Admission: RE | Admit: 2024-01-10 | Discharge: 2024-01-10 | Disposition: A | Discharge status: Home / Self Care | Payer: Commercial Managed Care - PPO | Attending: General Surgery | Admitting: General Surgery

## 2024-01-10 ENCOUNTER — Other Ambulatory Visit (HOSPITAL_COMMUNITY): Payer: Self-pay

## 2024-01-10 ENCOUNTER — Encounter (HOSPITAL_BASED_OUTPATIENT_CLINIC_OR_DEPARTMENT_OTHER): Payer: Self-pay | Admitting: General Surgery

## 2024-01-10 DIAGNOSIS — E039 Hypothyroidism, unspecified: Secondary | ICD-10-CM

## 2024-01-10 DIAGNOSIS — N6489 Other specified disorders of breast: Secondary | ICD-10-CM | POA: Diagnosis not present

## 2024-01-10 DIAGNOSIS — N6082 Other benign mammary dysplasias of left breast: Secondary | ICD-10-CM | POA: Diagnosis not present

## 2024-01-10 DIAGNOSIS — N6022 Fibroadenosis of left breast: Secondary | ICD-10-CM | POA: Diagnosis not present

## 2024-01-10 DIAGNOSIS — N6322 Unspecified lump in the left breast, upper inner quadrant: Secondary | ICD-10-CM

## 2024-01-10 DIAGNOSIS — N6032 Fibrosclerosis of left breast: Secondary | ICD-10-CM | POA: Diagnosis not present

## 2024-01-10 DIAGNOSIS — N6320 Unspecified lump in the left breast, unspecified quadrant: Secondary | ICD-10-CM

## 2024-01-10 DIAGNOSIS — L905 Scar conditions and fibrosis of skin: Secondary | ICD-10-CM | POA: Diagnosis not present

## 2024-01-10 DIAGNOSIS — N6092 Unspecified benign mammary dysplasia of left breast: Secondary | ICD-10-CM | POA: Diagnosis not present

## 2024-01-10 DIAGNOSIS — N6012 Diffuse cystic mastopathy of left breast: Secondary | ICD-10-CM | POA: Diagnosis not present

## 2024-01-10 DIAGNOSIS — N62 Hypertrophy of breast: Secondary | ICD-10-CM | POA: Diagnosis not present

## 2024-01-10 HISTORY — PX: RADIOACTIVE SEED GUIDED EXCISIONAL BREAST BIOPSY: SHX6490

## 2024-01-10 SURGERY — RADIOACTIVE SEED GUIDED BREAST BIOPSY
Anesthesia: General | Site: Breast | Laterality: Left

## 2024-01-10 MED ORDER — ACETAMINOPHEN 500 MG PO TABS
1000.0000 mg | ORAL_TABLET | ORAL | Status: AC
  Administered 2024-01-10: 1000 mg via ORAL

## 2024-01-10 MED ORDER — AMISULPRIDE (ANTIEMETIC) 5 MG/2ML IV SOLN: 10.0000 mg | Freq: Once | INTRAVENOUS | Status: DC | PRN

## 2024-01-10 MED ORDER — PHENYLEPHRINE HCL (PRESSORS) 10 MG/ML IV SOLN
INTRAVENOUS | Status: DC | PRN
  Administered 2024-01-10 (×3): 80 ug via INTRAVENOUS

## 2024-01-10 MED ORDER — KETOROLAC TROMETHAMINE 15 MG/ML IJ SOLN
15.0000 mg | INTRAMUSCULAR | Status: AC
  Administered 2024-01-10: 15 mg via INTRAVENOUS

## 2024-01-10 MED ORDER — LACTATED RINGERS IV SOLN: INTRAVENOUS | Status: DC

## 2024-01-10 MED ORDER — CEFAZOLIN SODIUM-DEXTROSE 2-4 GM/100ML-% IV SOLN
INTRAVENOUS | Status: AC
  Filled 2024-01-10: qty 100

## 2024-01-10 MED ORDER — BUPIVACAINE HCL (PF) 0.25 % IJ SOLN
INTRAMUSCULAR | Status: DC | PRN
  Administered 2024-01-10: 10 mL

## 2024-01-10 MED ORDER — ONDANSETRON HCL 4 MG/2ML IJ SOLN
INTRAMUSCULAR | Status: AC
  Filled 2024-01-10: qty 2

## 2024-01-10 MED ORDER — DEXAMETHASONE SODIUM PHOSPHATE 4 MG/ML IJ SOLN
INTRAMUSCULAR | Status: DC | PRN
  Administered 2024-01-10: 5 mg via INTRAVENOUS

## 2024-01-10 MED ORDER — CHLORHEXIDINE GLUCONATE CLOTH 2 % EX PADS: 6.0000 | MEDICATED_PAD | Freq: Once | CUTANEOUS | Status: DC

## 2024-01-10 MED ORDER — ONDANSETRON HCL 4 MG PO TABS
4.0000 mg | ORAL_TABLET | Freq: Three times a day (TID) | ORAL | 0 refills | Status: AC | PRN
  Filled 2024-01-10: qty 20, 7d supply, fill #0

## 2024-01-10 MED ORDER — LIDOCAINE HCL (CARDIAC) PF 100 MG/5ML IV SOSY
PREFILLED_SYRINGE | INTRAVENOUS | Status: DC | PRN
  Administered 2024-01-10: 50 mg via INTRAVENOUS

## 2024-01-10 MED ORDER — LIDOCAINE 2% (20 MG/ML) 5 ML SYRINGE
INTRAMUSCULAR | Status: AC
  Filled 2024-01-10: qty 5

## 2024-01-10 MED ORDER — MIDAZOLAM HCL 2 MG/2ML IJ SOLN
INTRAMUSCULAR | Status: AC
  Filled 2024-01-10: qty 2

## 2024-01-10 MED ORDER — ONDANSETRON HCL 4 MG/2ML IJ SOLN
4.0000 mg | Freq: Once | INTRAMUSCULAR | Status: DC | PRN
Start: 2024-01-10 — End: 2024-01-10

## 2024-01-10 MED ORDER — ENSURE PRE-SURGERY PO LIQD: 296.0000 mL | Freq: Once | ORAL | Status: DC

## 2024-01-10 MED ORDER — KETOROLAC TROMETHAMINE 15 MG/ML IJ SOLN
INTRAMUSCULAR | Status: AC
  Filled 2024-01-10: qty 1

## 2024-01-10 MED ORDER — SODIUM CHLORIDE 0.9 % IV SOLN: INTRAVENOUS | Status: DC | PRN

## 2024-01-10 MED ORDER — BUPIVACAINE HCL (PF) 0.25 % IJ SOLN
INTRAMUSCULAR | Status: AC
  Filled 2024-01-10: qty 30

## 2024-01-10 MED ORDER — PROPOFOL 10 MG/ML IV BOLUS
INTRAVENOUS | Status: AC
  Filled 2024-01-10: qty 20

## 2024-01-10 MED ORDER — ACETAMINOPHEN 500 MG PO TABS
ORAL_TABLET | ORAL | Status: AC
  Filled 2024-01-10: qty 2

## 2024-01-10 MED ORDER — BUPIVACAINE-EPINEPHRINE (PF) 0.25% -1:200000 IJ SOLN
INTRAMUSCULAR | Status: AC
  Filled 2024-01-10: qty 30

## 2024-01-10 MED ORDER — FENTANYL CITRATE (PF) 100 MCG/2ML IJ SOLN: 25.0000 ug | INTRAMUSCULAR | Status: DC | PRN

## 2024-01-10 MED ORDER — DEXAMETHASONE SODIUM PHOSPHATE 10 MG/ML IJ SOLN
INTRAMUSCULAR | Status: AC
  Filled 2024-01-10: qty 1

## 2024-01-10 MED ORDER — PHENYLEPHRINE 80 MCG/ML (10ML) SYRINGE FOR IV PUSH (FOR BLOOD PRESSURE SUPPORT)
PREFILLED_SYRINGE | INTRAVENOUS | Status: AC
  Filled 2024-01-10: qty 10

## 2024-01-10 MED ORDER — ONDANSETRON HCL 4 MG/2ML IJ SOLN
INTRAMUSCULAR | Status: DC | PRN
  Administered 2024-01-10: 4 mg via INTRAVENOUS

## 2024-01-10 MED ORDER — MIDAZOLAM HCL 5 MG/5ML IJ SOLN
INTRAMUSCULAR | Status: DC | PRN
  Administered 2024-01-10: 1 mg via INTRAVENOUS

## 2024-01-10 MED ORDER — PROPOFOL 10 MG/ML IV BOLUS
INTRAVENOUS | Status: DC | PRN
  Administered 2024-01-10: 180 mg via INTRAVENOUS

## 2024-01-10 MED ORDER — FENTANYL CITRATE (PF) 100 MCG/2ML IJ SOLN
INTRAMUSCULAR | Status: AC
  Filled 2024-01-10: qty 2

## 2024-01-10 MED ORDER — CEFAZOLIN SODIUM-DEXTROSE 2-4 GM/100ML-% IV SOLN
2.0000 g | INTRAVENOUS | Status: AC
  Administered 2024-01-10: 2 g via INTRAVENOUS

## 2024-01-10 MED ORDER — PROPOFOL 500 MG/50ML IV EMUL
INTRAVENOUS | Status: DC | PRN
  Administered 2024-01-10: 125 ug/kg/min via INTRAVENOUS

## 2024-01-10 MED ORDER — FENTANYL CITRATE (PF) 100 MCG/2ML IJ SOLN
INTRAMUSCULAR | Status: DC | PRN
  Administered 2024-01-10: 50 ug via INTRAVENOUS

## 2024-01-10 SURGICAL SUPPLY — 31 items
BINDER BREAST LRG (GAUZE/BANDAGES/DRESSINGS) IMPLANT
BINDER BREAST MEDIUM (GAUZE/BANDAGES/DRESSINGS) IMPLANT
BLADE SURG 15 STRL LF DISP TIS (BLADE) ×1 IMPLANT
CHLORAPREP W/TINT 26 (MISCELLANEOUS) ×1 IMPLANT
COVER BACK TABLE 60X90IN (DRAPES) ×1 IMPLANT
COVER MAYO STAND STRL (DRAPES) ×1 IMPLANT
COVER PROBE CYLINDRICAL 5X96 (MISCELLANEOUS) ×1 IMPLANT
DERMABOND ADVANCED .7 DNX12 (GAUZE/BANDAGES/DRESSINGS) ×1 IMPLANT
DRAPE LAPAROSCOPIC ABDOMINAL (DRAPES) ×1 IMPLANT
DRAPE UTILITY XL STRL (DRAPES) ×1 IMPLANT
ELECT COATED BLADE 2.86 ST (ELECTRODE) ×1 IMPLANT
ELECT REM PT RETURN 9FT ADLT (ELECTROSURGICAL) ×1
ELECTRODE REM PT RTRN 9FT ADLT (ELECTROSURGICAL) ×1 IMPLANT
GLOVE BIO SURGEON STRL SZ7 (GLOVE) ×2 IMPLANT
GLOVE BIOGEL PI IND STRL 7.5 (GLOVE) ×1 IMPLANT
GOWN STRL REUS W/ TWL LRG LVL3 (GOWN DISPOSABLE) ×2 IMPLANT
HEMOSTAT ARISTA ABSORB 3G PWDR (HEMOSTASIS) IMPLANT
KIT MARKER MARGIN INK (KITS) ×1 IMPLANT
NDL HYPO 25X1 1.5 SAFETY (NEEDLE) ×1 IMPLANT
NEEDLE HYPO 25X1 1.5 SAFETY (NEEDLE) ×1
PACK BASIN DAY SURGERY FS (CUSTOM PROCEDURE TRAY) ×1 IMPLANT
PENCIL SMOKE EVACUATOR (MISCELLANEOUS) ×1 IMPLANT
SLEEVE SCD COMPRESS KNEE MED (STOCKING) ×1 IMPLANT
SPONGE T-LAP 4X18 ~~LOC~~+RFID (SPONGE) ×1 IMPLANT
STRIP CLOSURE SKIN 1/2X4 (GAUZE/BANDAGES/DRESSINGS) ×1 IMPLANT
SUT MON AB 5-0 PS2 18 (SUTURE) IMPLANT
SUT VIC AB 2-0 SH 27XBRD (SUTURE) ×1 IMPLANT
SUT VIC AB 3-0 SH 27X BRD (SUTURE) ×1 IMPLANT
SYR CONTROL 10ML LL (SYRINGE) ×1 IMPLANT
TOWEL GREEN STERILE FF (TOWEL DISPOSABLE) ×1 IMPLANT
TRAY FAXITRON CT DISP (TRAY / TRAY PROCEDURE) ×1 IMPLANT

## 2024-01-10 NOTE — Transfer of Care (Signed)
Immediate Anesthesia Transfer of Care Note  Patient: Cynthia Ranger, MD  Procedure(s) Performed: LEFT BREAST SEED GUIDED EXCISIONAL BIOPSY (Left: Breast)  Patient Location: PACU  Anesthesia Type:General  Level of Consciousness: awake, alert , and oriented  Airway & Oxygen Therapy: Patient Spontanous Breathing and Patient connected to face mask oxygen  Post-op Assessment: Report given to RN and Post -op Vital signs reviewed and stable  Post vital signs: Reviewed and stable  Last Vitals:  Vitals Value Taken Time  BP 93/64 01/10/24 0815  Temp    Pulse    Resp 14 01/10/24 0816  SpO2    Vitals shown include unfiled device data.  Last Pain:  Vitals:   01/10/24 0637  TempSrc: Oral  PainSc: 0-No pain      Patients Stated Pain Goal: 7 (01/10/24 6606)  Complications: No notable events documented.

## 2024-01-10 NOTE — Op Note (Signed)
Preoperative diagnosis:Left breast mass with atypical fna Postoperative diagnosis: Same as above Procedure: left breast seed guided excisional biopsy Surgeon: Dr. Harden Mo Specimens: left breast tissue marked with paint containing seed Anesthesia: General Complications: None Drains: None Estimated blood loss: Minimal Sponge needle count was correct completion Disposition recovery stable condition   Indications: 48 year old female who has prior benign biopsies with her first mammogram but otherwise no history. She underwent a screening mammogram that shows C density breast tissue. There was a possible asymmetry in the left breast. This persisted and on ultrasound was a 9 x 4 x 9 mm oval hypoechoic mass. She has no axillary adenopathy. This appeared to be a somewhat solid and cystic mass. She underwent an aspiration and this shows atypical cells. We discussed excisional biopsy  Procedure: After informed consent was obtained patient was taken to the operating room.  She was given antibiotics.  SCDs were in place.  She was placed under general anesthesia without complication.  She was prepped and draped in a standard sterile surgical fashion.   The seed was in the upper breast. I infiltrated marcaine and made a periareolar incision in order to hide the scar later.  I used the neoprobe to guide the excision of the seed in the surrounding tissue.  Mammogram confirmed removal of  the seed.     I then obtained hemostasis.  I closed the breast tissue with 2-0 Vicryl.  The skin was closed with 3-0 Vicryl and 5-0 Monocryl.  Glue and Steri-Strips were applied.  She tolerated this well was extubated and transferred recovery stable.

## 2024-01-10 NOTE — Discharge Instructions (Addendum)
[facility] Office Phone Number [phone]  POST OP INSTRUCTIONS Take 400 mg of ibuprofen every 8 hours or 650 mg tylenol every 6 hours for next 72 hours then as needed. Use ice several times daily also.  Take your usually prescribed medications unless otherwise directed You should eat very light the first 24 hours after surgery, such as soup, crackers, pudding, etc.  Resume your normal diet the day after surgery. Most patients will experience some swelling and bruising in the breast.  Ice packs and a good support bra will help.  Wear the breast binder provided or a sports bra for 72 hours day and night.  After that wear a sports bra during the day until you return to the office. Swelling and bruising can take several days to resolve.  It is common to experience some constipation if taking pain medication after surgery.  Increasing fluid intake and taking a stool softener will usually help or prevent this problem from occurring.  A mild laxative (Milk of Magnesia or Miralax) should be taken according to package directions if there are no bowel movements after 48 hours. I used skin glue on the incision, you may shower in 24 hours.  The glue will flake off over the next 2-3 weeks.  Any sutures or staples will be removed at the office during your follow-up visit. ACTIVITIES:  You may resume regular daily activities (gradually increasing) beginning the next day.  Wearing a good support bra or sports bra minimizes pain and swelling.  You may have sexual intercourse when it is comfortable. You may drive when you no longer are taking prescription pain medication, you can comfortably wear a seatbelt, and you can safely maneuver your car and apply brakes. RETURN TO WORK:  ______________________________________________________________________________________ Bonita Quin should see your doctor in the office for a follow-up appointment approximately two weeks after your surgery.  Your doctor's nurse will  typically make your follow-up appointment when she calls you with your pathology report.  Expect your pathology report 3-4 business days after your surgery.  You may call to check if you do not hear from [place] after three days. OTHER INSTRUCTIONS: _______________________________________________________________________________________________ _____________________________________________________________________________________________________________________________________ _____________________________________________________________________________________________________________________________________ _____________________________________________________________________________________________________________________________________  WHEN TO CALL DR WAKEFIELD: Fever over 101.0 Nausea and/or vomiting. Extreme swelling or bruising. Continued bleeding from incision. Increased pain, redness, or drainage from the incision.  The clinic staff is available to answer your questions during regular business hours.  Please don't hesitate to call and ask to speak to one of the nurses for clinical concerns.  If you have a medical emergency, go to the nearest emergency room or call 911.  A surgeon from [facility] is always on call at the hospital.  For further questions, please visit [email or web address]  Post Anesthesia Home Care Instructions  Activity: Get plenty of rest for the remainder of the day. A responsible individual must stay with you for 24 hours following the procedure.  For the next 24 hours, DO NOT: -Drive a car -Advertising copywriter -Drink alcoholic beverages -Take any medication unless instructed by your physician -Make any legal decisions or sign important papers.  Meals: Start with liquid foods such as gelatin or soup. Progress to regular foods as tolerated. Avoid greasy, spicy, heavy foods. If nausea and/or vomiting occur, drink only clear liquids until the  nausea and/or vomiting subsides. Call your physician if vomiting continues.  Special Instructions/Symptoms: Your throat may feel dry or sore from the anesthesia or the breathing tube placed in your throat during surgery.  If this causes discomfort, gargle with warm salt water. The discomfort should disappear within 24 hours.  If you had a scopolamine patch placed behind your ear for the management of post- operative nausea and/or vomiting:  1. The medication in the patch is effective for 72 hours, after which it should be removed.  Wrap patch in a tissue and discard in the trash. Wash hands thoroughly with soap and water. 2. You may remove the patch earlier than 72 hours if you experience unpleasant side effects which may include dry mouth, dizziness or visual disturbances. 3. Avoid touching the patch. Wash your hands with soap and water after contact with the patch.      Next dose of Tylenol and Ibuprofen may be taken at 1:30p

## 2024-01-10 NOTE — Anesthesia Procedure Notes (Signed)
Procedure Name: LMA Insertion Date/Time: 01/10/2024 7:36 AM  Performed by: Cleda Clarks, CRNAPre-anesthesia Checklist: Patient identified, Emergency Drugs available, Suction available and Patient being monitored Patient Re-evaluated:Patient Re-evaluated prior to induction Oxygen Delivery Method: Circle system utilized Preoxygenation: Pre-oxygenation with 100% oxygen Induction Type: IV induction Ventilation: Mask ventilation without difficulty LMA: LMA inserted LMA Size: 3.0 Number of attempts: 1 Placement Confirmation: positive ETCO2 Tube secured with: Tape Dental Injury: Teeth and Oropharynx as per pre-operative assessment

## 2024-01-10 NOTE — Anesthesia Preprocedure Evaluation (Signed)
Anesthesia Evaluation  Patient identified by MRN, date of birth, ID band Patient awake    Reviewed: Allergy & Precautions, NPO status , Patient's Chart, lab work & pertinent test results  Airway Mallampati: II  TM Distance: >3 FB Neck ROM: Full    Dental  (+) Teeth Intact, Dental Advisory Given   Pulmonary neg pulmonary ROS   Pulmonary exam normal breath sounds clear to auscultation       Cardiovascular negative cardio ROS Normal cardiovascular exam Rhythm:Regular Rate:Normal     Neuro/Psych  Headaches    GI/Hepatic negative GI ROS, Neg liver ROS,,,  Endo/Other  Hypothyroidism    Renal/GU negative Renal ROS     Musculoskeletal negative musculoskeletal ROS (+)    Abdominal   Peds  Hematology negative hematology ROS (+)   Anesthesia Other Findings Day of surgery medications reviewed with the patient.  LEFT BREAST MASS  Reproductive/Obstetrics                             Anesthesia Physical Anesthesia Plan  ASA: 2  Anesthesia Plan: General   Post-op Pain Management: Tylenol PO (pre-op)*   Induction: Intravenous  PONV Risk Score and Plan: 3 and Midazolam, Dexamethasone and Ondansetron  Airway Management Planned: LMA  Additional Equipment:   Intra-op Plan:   Post-operative Plan: Extubation in OR  Informed Consent: I have reviewed the patients History and Physical, chart, labs and discussed the procedure including the risks, benefits and alternatives for the proposed anesthesia with the patient or authorized representative who has indicated his/her understanding and acceptance.     Dental advisory given  Plan Discussed with: CRNA  Anesthesia Plan Comments:        Anesthesia Quick Evaluation

## 2024-01-10 NOTE — H&P (Signed)
  48 year old female who has prior benign biopsies with her first mammogram but otherwise no history. She has no mass or discharge. She has no family history. She underwent a screening mammogram that shows C density breast tissue. There was a possible asymmetry in the left breast. This persisted and on ultrasound was a 9 x 4 x 9 mm oval hypoechoic mass. She has no axillary adenopathy. This appeared to be a somewhat solid and cystic mass. She underwent an aspiration and this shows atypical cells. She is now here today to discuss her options.  Review of Systems: A complete review of systems was obtained from the patient. I have reviewed this information and discussed as appropriate with the patient. See HPI as well for other ROS.  Review of Systems  All other systems reviewed and are negative.   Medical History: Past Medical History:  Diagnosis Date  Thyroid disease    Past Surgical History:  Procedure Laterality Date  CATARACT EXTRACTION   No Known Allergies  Current Outpatient Medications on File Prior to Visit  Medication Sig Dispense Refill  cholecalciferol 1000 unit tablet Take by mouth  levothyroxine (SYNTHROID) 75 MCG tablet Take 75 mcg by mouth  linaCLOtide (LINZESS) 290 mcg capsule Take 290 mcg by mouth  multivitamin tablet Take 1 tablet by mouth once daily  vit A/vit C/vit E/zinc/copper (PRESERVISION AREDS ORAL) Take by mouth    Family History  Problem Relation Age of Onset  Stroke Mother  High blood pressure (Hypertension) Mother  Hyperlipidemia (Elevated cholesterol) Mother  Coronary Artery Disease (Blocked arteries around heart) Brother    Social History   Tobacco Use  Smoking Status Never  Smokeless Tobacco Never  Marital status: Married  Tobacco Use  Smoking status: Never  Smokeless tobacco: Never  Substance and Sexual Activity  Alcohol use: Never  Drug use: Never   Objective:   Vitals:  11/29/23 1138 11/29/23 1139  BP: 104/70  Pulse: 104  Temp:  36.8 C (98.3 F)  SpO2: 97%  Weight: 53.3 kg (117 lb 9.6 oz)  Height: 158.8 cm (5' 2.5")  PainSc: 0-No pain  PainLoc: Breast   Body mass index is 21.17 kg/m.  Physical Exam Vitals reviewed.  Constitutional:  Appearance: Normal appearance.  Chest:  Breasts: Left: No inverted nipple, mass or nipple discharge.  Lymphadenopathy:  Upper Body:  Left upper body: No axillary adenopathy.  Neurological:  Mental Status: She is alert.   Assessment and Plan:   Mass of upper inner quadrant of left breast  Left breast seed guided excisional biopsy  We discussed mass and fna findings. I think needs excision. We discussed seed guided excisional biopsy with risks and recovery. Will proceed as soon as we can.

## 2024-01-10 NOTE — Anesthesia Postprocedure Evaluation (Signed)
Anesthesia Post Note  Patient: Cynthia Ranger, MD  Procedure(s) Performed: LEFT BREAST SEED GUIDED EXCISIONAL BIOPSY (Left: Breast)     Patient location during evaluation: PACU Anesthesia Type: General Level of consciousness: awake and alert Pain management: pain level controlled Vital Signs Assessment: post-procedure vital signs reviewed and stable Respiratory status: spontaneous breathing, nonlabored ventilation and respiratory function stable Cardiovascular status: blood pressure returned to baseline and stable Postop Assessment: no apparent nausea or vomiting Anesthetic complications: no   No notable events documented.  Last Vitals:  Vitals:   01/10/24 0900 01/10/24 0923  BP: 99/61 (!) 112/50  Pulse: 72 78  Resp: 15 18  Temp:  (!) 36.2 C  SpO2: 100% 100%    Last Pain:  Vitals:   01/10/24 0923  TempSrc:   PainSc: 0-No pain                 Collene Schlichter

## 2024-01-10 NOTE — Interval H&P Note (Signed)
History and Physical Interval Note:  01/10/2024 7:01 AM  Cynthia Gurevich, MD  has presented today for surgery, with the diagnosis of LEFT BREAST MASS.  The various methods of treatment have been discussed with the patient and family. After consideration of risks, benefits and other options for treatment, the patient has consented to  Procedure(s) with comments: LEFT BREAST SEED GUIDED EXCISIONAL BIOPSY (Left) - LMA as a surgical intervention.  The patient's history has been reviewed, patient examined, no change in status, stable for surgery.  I have reviewed the patient's chart and labs.  Questions were answered to the patient's satisfaction.     [name]

## 2024-01-11 ENCOUNTER — Encounter (HOSPITAL_BASED_OUTPATIENT_CLINIC_OR_DEPARTMENT_OTHER): Payer: Self-pay | Admitting: General Surgery

## 2024-01-13 LAB — SURGICAL PATHOLOGY

## 2024-01-16 ENCOUNTER — Encounter: Payer: Self-pay | Admitting: Internal Medicine

## 2024-01-17 MED ORDER — SULFAMETHOXAZOLE-TRIMETHOPRIM 800-160 MG PO TABS: 1.0000 | ORAL_TABLET | Freq: Two times a day (BID) | ORAL | 0 refills | Status: AC

## 2024-01-17 MED ORDER — URELLE 81 MG PO TABS: 1.0000 | ORAL_TABLET | Freq: Four times a day (QID) | ORAL | 0 refills | Status: AC | PRN

## 2024-01-31 ENCOUNTER — Encounter (HOSPITAL_BASED_OUTPATIENT_CLINIC_OR_DEPARTMENT_OTHER): Payer: Self-pay | Admitting: General Surgery

## 2024-01-31 NOTE — Addendum Note (Signed)
Addendum  created 01/31/24 0703 by Collene Schlichter, MD   Intraprocedure Event edited, Intraprocedure Staff edited

## 2024-02-14 ENCOUNTER — Other Ambulatory Visit (HOSPITAL_COMMUNITY): Payer: Self-pay

## 2024-02-14 MED ORDER — OSELTAMIVIR PHOSPHATE 75 MG PO CAPS
75.0000 mg | ORAL_CAPSULE | Freq: Two times a day (BID) | ORAL | 0 refills | Status: AC
  Filled 2024-02-14: qty 14, 7d supply, fill #0

## 2024-02-24 DIAGNOSIS — Z961 Presence of intraocular lens: Secondary | ICD-10-CM | POA: Diagnosis not present

## 2024-02-24 DIAGNOSIS — H25041 Posterior subcapsular polar age-related cataract, right eye: Secondary | ICD-10-CM | POA: Diagnosis not present

## 2024-02-25 ENCOUNTER — Telehealth: Payer: Self-pay | Admitting: Neurology

## 2024-02-25 NOTE — Telephone Encounter (Signed)
 Submitted auth renewal via CMM, status is pending. Key: [id number]

## 2024-02-26 NOTE — Telephone Encounter (Signed)
 Received approval, pt will continue to fill through [facility].  PA#: [id number] (02/25/24-02/23/25)

## 2024-03-06 ENCOUNTER — Other Ambulatory Visit (HOSPITAL_COMMUNITY): Payer: Self-pay

## 2024-03-06 DIAGNOSIS — Z124 Encounter for screening for malignant neoplasm of cervix: Secondary | ICD-10-CM | POA: Diagnosis not present

## 2024-03-06 DIAGNOSIS — Z01411 Encounter for gynecological examination (general) (routine) with abnormal findings: Secondary | ICD-10-CM | POA: Diagnosis not present

## 2024-03-06 DIAGNOSIS — Z114 Encounter for screening for human immunodeficiency virus [HIV]: Secondary | ICD-10-CM | POA: Diagnosis not present

## 2024-03-06 DIAGNOSIS — Z01419 Encounter for gynecological examination (general) (routine) without abnormal findings: Secondary | ICD-10-CM | POA: Diagnosis not present

## 2024-03-06 DIAGNOSIS — Z118 Encounter for screening for other infectious and parasitic diseases: Secondary | ICD-10-CM | POA: Diagnosis not present

## 2024-03-06 DIAGNOSIS — Z1331 Encounter for screening for depression: Secondary | ICD-10-CM | POA: Diagnosis not present

## 2024-03-06 DIAGNOSIS — Z113 Encounter for screening for infections with a predominantly sexual mode of transmission: Secondary | ICD-10-CM | POA: Diagnosis not present

## 2024-03-06 DIAGNOSIS — Z1159 Encounter for screening for other viral diseases: Secondary | ICD-10-CM | POA: Diagnosis not present

## 2024-03-06 DIAGNOSIS — Z0142 Encounter for cervical smear to confirm findings of recent normal smear following initial abnormal smear: Secondary | ICD-10-CM | POA: Diagnosis not present

## 2024-03-06 MED ORDER — URELLE 81 MG PO TABS
1.0000 | ORAL_TABLET | Freq: Four times a day (QID) | ORAL | 1 refills | Status: DC
  Filled 2024-03-06 (×2): qty 30, 8d supply, fill #0

## 2024-03-06 MED ORDER — VALACYCLOVIR HCL 500 MG PO TABS
500.0000 mg | ORAL_TABLET | Freq: Every day | ORAL | 1 refills | Status: AC
  Filled 2024-03-06: qty 100, 90d supply, fill #0
  Filled 2024-06-16: qty 100, 90d supply, fill #1

## 2024-03-09 ENCOUNTER — Other Ambulatory Visit (HOSPITAL_COMMUNITY): Payer: Self-pay

## 2024-03-09 MED ORDER — URIBEL 118 MG PO CAPS
1.0000 | ORAL_CAPSULE | Freq: Four times a day (QID) | ORAL | 0 refills | Status: AC | PRN
  Filled 2024-03-09: qty 20, 5d supply, fill #0

## 2024-03-10 ENCOUNTER — Other Ambulatory Visit (HOSPITAL_COMMUNITY): Payer: Self-pay

## 2024-03-10 ENCOUNTER — Other Ambulatory Visit: Payer: Self-pay

## 2024-03-13 ENCOUNTER — Other Ambulatory Visit: Payer: Self-pay

## 2024-03-13 ENCOUNTER — Other Ambulatory Visit: Payer: Self-pay | Admitting: Neurology

## 2024-03-13 ENCOUNTER — Other Ambulatory Visit: Payer: Self-pay | Admitting: Pharmacy Technician

## 2024-03-13 NOTE — Progress Notes (Signed)
 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  RR sent to MD

## 2024-03-16 ENCOUNTER — Other Ambulatory Visit: Payer: Self-pay

## 2024-03-16 ENCOUNTER — Other Ambulatory Visit (HOSPITAL_COMMUNITY): Payer: Self-pay

## 2024-03-16 MED ORDER — BOTOX 200 UNITS IJ SOLR
INTRAMUSCULAR | 3 refills | Status: AC
  Filled 2024-03-16: qty 1, 84d supply, fill #0
  Filled 2024-06-08: qty 1, 84d supply, fill #1
  Filled 2024-09-07: qty 1, 84d supply, fill #2
  Filled 2024-11-26 – 2024-12-31 (×2): qty 1, 84d supply, fill #3

## 2024-03-17 ENCOUNTER — Other Ambulatory Visit: Payer: Self-pay

## 2024-03-19 ENCOUNTER — Other Ambulatory Visit (HOSPITAL_COMMUNITY): Payer: Self-pay

## 2024-03-19 ENCOUNTER — Other Ambulatory Visit: Payer: Self-pay

## 2024-03-19 ENCOUNTER — Encounter: Payer: Self-pay | Admitting: Internal Medicine

## 2024-03-19 NOTE — Progress Notes (Unsigned)
 Subjective:    Patient ID: Cynthia Ranger, MD, female    DOB: November 09, 1976, 48 y.o.   MRN: [id number]     HPI Cynthia Ramsey is here for follow up of her chronic medical problems.  Left breast fna with atypical cells - saw surgery who advised excision -->seed guided excisional bx which was done 01/10/24  Medications and allergies reviewed with patient and updated if appropriate.  Current Outpatient Medications on File Prior to Visit  Medication Sig Dispense Refill   botulinum toxin Type A (BOTOX) 200 units injection Provider to inject 155 units into the muscles of the head and neck every 12 weeks. Discard remainder. 1 each 3   Cholecalciferol (VITAMIN D) 2000 UNITS tablet Take 1 tablet (2,000 Units total) by mouth daily. (Patient taking differently: Take 5,000 Units by mouth every other day.) 30 tablet 11   loratadine (CLARITIN) 10 MG tablet Take 10 mg by mouth daily as needed for allergies or rhinitis.     Multiple Vitamin (MULTIVITAMIN) tablet Take 1 tablet by mouth daily.     ondansetron (ZOFRAN) 4 MG tablet Take 1 tablet (4 mg total) by mouth every 8 (eight) hours as needed for nausea. 20 tablet 0   rizatriptan (MAXALT-MLT) 10 MG disintegrating tablet Take 1 tablet (10 mg total) by mouth as needed for migraine. May repeat in 2 hours if needed 12 tablet 12   tacrolimus (PROTOPIC) 0.1 % ointment Apply 1 application (a small amount) topically to skin 2 (two) times daily as needed. 30 g 0   tacrolimus (PROTOPIC) 0.1 % ointment Apply small amount to skin 2 (two) times daily as needed, for flare ups 30 g 2   Urelle (URELLE/URISED) 81 MG TABS tablet Take 1 tablet (81 mg total) by mouth every 6 (six) hours as needed for bladder spasms. 40 tablet 0   valACYclovir (VALTREX) 500 MG tablet Take 1 tablet (500 mg total) by mouth daily for suppression or twice daily for 3 days with sex. 100 tablet 1   Current Facility-Administered Medications on File Prior to Visit  Medication Dose Route Frequency  Provider Last Rate Last Admin   botulinum toxin Type A (BOTOX) injection 155 Units  155 Units Intramuscular Once Anson Fret, MD       botulinum toxin Type A (BOTOX) injection 155 Units  155 Units Intramuscular Once Anson Fret, MD         Review of Systems  Constitutional:  Negative for fever.  Respiratory:  Negative for cough, shortness of breath and wheezing.   Cardiovascular:  Negative for chest pain, palpitations and leg swelling.  Neurological:  Positive for headaches (migraines).       Objective:   Vitals:   03/20/24 0906  BP: 102/66  Pulse: 69  Temp: 98.1 F (36.7 C)  SpO2: 97%   BP Readings from Last 3 Encounters:  03/20/24 102/66  01/10/24 (!) 112/50  03/01/23 104/68   Wt Readings from Last 3 Encounters:  03/20/24 116 lb (52.6 kg)  01/10/24 121 lb 0.5 oz (54.9 kg)  03/01/23 116 lb (52.6 kg)   Body mass index is 20.88 kg/m.    Physical Exam Constitutional:      General: She is not in acute distress.    Appearance: Normal appearance.  HENT:     Head: Normocephalic and atraumatic.  Eyes:     Conjunctiva/sclera: Conjunctivae normal.  Cardiovascular:     Rate and Rhythm: Normal rate and regular rhythm.  Heart sounds: Normal heart sounds.  Pulmonary:     Effort: Pulmonary effort is normal. No respiratory distress.     Breath sounds: Normal breath sounds. No wheezing.  Musculoskeletal:     Cervical back: Neck supple.     Right lower leg: No edema.     Left lower leg: No edema.  Lymphadenopathy:     Cervical: No cervical adenopathy.  Skin:    General: Skin is warm and dry.     Findings: No rash.  Neurological:     Mental Status: She is alert. Mental status is at baseline.  Psychiatric:        Mood and Affect: Mood normal.        Behavior: Behavior normal.        Lab Results  Component Value Date   WBC 5.8 03/01/2023   HGB 14.0 03/01/2023   HCT 41.7 03/01/2023   PLT 251.0 03/01/2023   GLUCOSE 95 03/01/2023   CHOL 184  03/01/2023   TRIG 103.0 03/01/2023   HDL 61.60 03/01/2023   LDLCALC 101 (H) 03/01/2023   ALT 22 03/01/2023   AST 22 03/01/2023   NA 139 03/01/2023   K 4.5 03/01/2023   CL 105 03/01/2023   CREATININE 0.77 03/01/2023   BUN 10 03/01/2023   CO2 28 03/01/2023   TSH 1.37 03/01/2023   HGBA1C 5.3 04/03/2016     Assessment & Plan:    See Problem List for Assessment and Plan of chronic medical problems.

## 2024-03-19 NOTE — Patient Instructions (Addendum)
      Blood work was ordered.       Medications changes include :   None      Return in about 1 year (around 03/20/2025) for follow up.

## 2024-03-20 ENCOUNTER — Ambulatory Visit: Admitting: Internal Medicine

## 2024-03-20 ENCOUNTER — Other Ambulatory Visit: Payer: Self-pay

## 2024-03-20 ENCOUNTER — Encounter: Payer: Self-pay | Admitting: Internal Medicine

## 2024-03-20 ENCOUNTER — Other Ambulatory Visit (HOSPITAL_COMMUNITY): Payer: Self-pay

## 2024-03-20 VITALS — BP 102/66 | HR 69 | Temp 98.1°F | Ht 62.5 in | Wt 116.0 lb

## 2024-03-20 DIAGNOSIS — E559 Vitamin D deficiency, unspecified: Secondary | ICD-10-CM | POA: Diagnosis not present

## 2024-03-20 DIAGNOSIS — E038 Other specified hypothyroidism: Secondary | ICD-10-CM

## 2024-03-20 DIAGNOSIS — K5909 Other constipation: Secondary | ICD-10-CM

## 2024-03-20 DIAGNOSIS — G43709 Chronic migraine without aura, not intractable, without status migrainosus: Secondary | ICD-10-CM

## 2024-03-20 LAB — CBC WITH DIFFERENTIAL/PLATELET
Basophils Absolute: 0.1 10*3/uL (ref 0.0–0.1)
Basophils Relative: 1.2 % (ref 0.0–3.0)
Eosinophils Absolute: 0.3 10*3/uL (ref 0.0–0.7)
Eosinophils Relative: 4.2 % (ref 0.0–5.0)
HCT: 39 % (ref 36.0–46.0)
Hemoglobin: 13.2 g/dL (ref 12.0–15.0)
Lymphocytes Relative: 26.5 % (ref 12.0–46.0)
Lymphs Abs: 1.7 10*3/uL (ref 0.7–4.0)
MCHC: 34 g/dL (ref 30.0–36.0)
MCV: 90.9 fl (ref 78.0–100.0)
Monocytes Absolute: 0.4 10*3/uL (ref 0.1–1.0)
Monocytes Relative: 6.2 % (ref 3.0–12.0)
Neutro Abs: 4 10*3/uL (ref 1.4–7.7)
Neutrophils Relative %: 61.9 % (ref 43.0–77.0)
Platelets: 291 10*3/uL (ref 150.0–400.0)
RBC: 4.29 Mil/uL (ref 3.87–5.11)
RDW: 13.8 % (ref 11.5–15.5)
WBC: 6.4 10*3/uL (ref 4.0–10.5)

## 2024-03-20 LAB — COMPREHENSIVE METABOLIC PANEL WITH GFR
ALT: 14 U/L (ref 0–35)
AST: 17 U/L (ref 0–37)
Albumin: 4.4 g/dL (ref 3.5–5.2)
Alkaline Phosphatase: 52 U/L (ref 39–117)
BUN: 14 mg/dL (ref 6–23)
CO2: 26 meq/L (ref 19–32)
Calcium: 8.9 mg/dL (ref 8.4–10.5)
Chloride: 105 meq/L (ref 96–112)
Creatinine, Ser: 0.8 mg/dL (ref 0.40–1.20)
GFR: 87.36 mL/min (ref >60.00)
Glucose, Bld: 95 mg/dL (ref 70–99)
Potassium: 4.2 meq/L (ref 3.5–5.1)
Sodium: 139 meq/L (ref 135–145)
Total Bilirubin: 0.5 mg/dL (ref 0.2–1.2)
Total Protein: 6.8 g/dL (ref 6.0–8.3)

## 2024-03-20 LAB — LIPID PANEL
Cholesterol: 178 mg/dL (ref 0–200)
HDL: 51.2 mg/dL (ref >39.00)
LDL Cholesterol: 111 mg/dL — ABNORMAL HIGH (ref 0–99)
NonHDL: 126.99
Total CHOL/HDL Ratio: 3
Triglycerides: 80 mg/dL (ref 0.0–149.0)
VLDL: 16 mg/dL (ref 0.0–40.0)

## 2024-03-20 LAB — TSH: TSH: 1.51 u[IU]/mL (ref 0.35–5.50)

## 2024-03-20 LAB — VITAMIN D 25 HYDROXY (VIT D DEFICIENCY, FRACTURES): VITD: 58.46 ng/mL (ref 30.00–100.00)

## 2024-03-20 MED ORDER — LINACLOTIDE 290 MCG PO CAPS
290.0000 ug | ORAL_CAPSULE | Freq: Every day | ORAL | 3 refills | Status: AC
  Filled 2024-03-20 – 2024-03-22 (×2): qty 90, 90d supply, fill #0
  Filled 2024-06-16: qty 90, 90d supply, fill #1
  Filled 2024-09-14: qty 90, 90d supply, fill #2
  Filled 2024-12-20 – 2024-12-23 (×2): qty 90, 90d supply, fill #3

## 2024-03-20 MED ORDER — TRETINOIN 0.025 % EX CREA
TOPICAL_CREAM | Freq: Every day | CUTANEOUS | 3 refills | Status: DC
  Filled 2024-03-20: qty 45, 30d supply, fill #0

## 2024-03-20 MED ORDER — TRETINOIN 0.025 % EX CREA: TOPICAL_CREAM | Freq: Every day | CUTANEOUS | 3 refills | Status: AC

## 2024-03-20 MED ORDER — LEVOTHYROXINE SODIUM 75 MCG PO TABS
75.0000 ug | ORAL_TABLET | Freq: Every day | ORAL | 3 refills | Status: AC
  Filled 2024-03-20 – 2024-03-22 (×2): qty 90, 90d supply, fill #0
  Filled 2024-06-16: qty 90, 90d supply, fill #1
  Filled 2024-09-14: qty 90, 90d supply, fill #2
  Filled 2024-12-20: qty 30, 30d supply, fill #3
  Filled 2024-12-22: qty 90, 90d supply, fill #3

## 2024-03-20 NOTE — Assessment & Plan Note (Addendum)
 Chronic Fairly controlled Amitiza not effective Linzess 290 mcg daily MiraLAX

## 2024-03-20 NOTE — Assessment & Plan Note (Signed)
 Chronic Taking vitamin D daily Check vitamin D level

## 2024-03-20 NOTE — Assessment & Plan Note (Signed)
Chronic  Clinically euthyroid Currently taking levothyroxine 75 mcg daily Check tsh  Titrate med dose if needed  

## 2024-03-20 NOTE — Assessment & Plan Note (Signed)
 Chronic Management per neurology - Dr Lucia Gaskins Doing Botox injections Maxalt as needed

## 2024-03-23 ENCOUNTER — Other Ambulatory Visit (HOSPITAL_COMMUNITY): Payer: Self-pay

## 2024-03-23 ENCOUNTER — Ambulatory Visit: Payer: Commercial Managed Care - PPO | Admitting: Neurology

## 2024-03-23 DIAGNOSIS — G43709 Chronic migraine without aura, not intractable, without status migrainosus: Secondary | ICD-10-CM

## 2024-03-23 MED ORDER — ONABOTULINUMTOXINA 200 UNITS IJ SOLR
155.0000 [IU] | Freq: Once | INTRAMUSCULAR | Status: AC
  Administered 2024-03-23: 155 [IU] via INTRAMUSCULAR

## 2024-03-23 NOTE — Progress Notes (Signed)
 Consent Form Botulism Toxin Injection For Chronic Migraine   03/23/2024: stable, still doing well.  12/30/2023: >80% improvement in migraine frequency and severity.Patient feels that her migraines cause her jaw to ache in her jaw aching also can cause her migraines to worsen it is a trigger for migraines included 5 units in each masseter to see if that helps with migraine severity.  09/18/2023: stable 06/26/2023: Stable >80% improvement in migraine frequency and severity. 04/02/2023: stable 11/01/2022: stable 07/03/2022: stable 02/16/2022: Stable >80% improvement in migraine frequency and severity.  Reviewed orally with patient, additionally signature is on file:  Botulism toxin has been approved by the Federal drug administration for treatment of chronic migraine. Botulism toxin does not cure chronic migraine and it may not be effective in some patients.  The administration of botulism toxin is accomplished by injecting a small amount of toxin into the muscles of the neck and head. Dosage must be titrated for each individual. Any benefits resulting from botulism toxin tend to wear off after 3 months with a repeat injection required if benefit is to be maintained. Injections are usually done every 3-4 months with maximum effect peak achieved by about 2 or 3 weeks. Botulism toxin is expensive and you should be sure of what costs you will incur resulting from the injection.  The side effects of botulism toxin use for chronic migraine may include:   -Transient, and usually mild, facial weakness with facial injections  -Transient, and usually mild, head or neck weakness with head/neck injections  -Reduction or loss of forehead facial animation due to forehead muscle weakness  -Eyelid drooping  -Dry eye  -Pain at the site of injection or bruising at the site of injection  -Double vision  -Potential unknown long term risks  Contraindications: You should not have Botox if you are pregnant, nursing,  allergic to albumin, have an infection, skin condition, or muscle weakness at the site of the injection, or have myasthenia gravis, Lambert-Eaton syndrome, or ALS.  It is also possible that as with any injection, there may be an allergic reaction or no effect from the medication. Reduced effectiveness after repeated injections is sometimes seen and rarely infection at the injection site may occur. All care will be taken to prevent these side effects. If therapy is given over a long time, atrophy and wasting in the muscle injected may occur. Occasionally the patient's become refractory to treatment because they develop antibodies to the toxin. In this event, therapy needs to be modified.  I have read the above information and consent to the administration of botulism toxin.    BOTOX PROCEDURE NOTE FOR MIGRAINE HEADACHE    Contraindications and precautions discussed with patient(above). Aseptic procedure was observed and patient tolerated procedure. Procedure performed by Dr. Artemio Aly  The condition has existed for more than 6 months, and pt does not have a diagnosis of ALS, Myasthenia Gravis or Lambert-Eaton Syndrome.  Risks and benefits of injections discussed and pt agrees to proceed with the procedure.  Written consent obtained  These injections are medically necessary. Pt  receives good benefits from these injections. These injections do not cause sedations or hallucinations which the oral therapies may cause.  Description of procedure:  The patient was placed in a sitting position. The standard protocol was used for Botox as follows, with 5 units of Botox injected at each site:   -Procerus muscle, midline injection  -Corrugator muscle, bilateral injection  -Frontalis muscle, bilateral injection, with 2 sites each side, medial injection  was performed in the upper one third of the frontalis muscle, in the region vertical from the medial inferior edge of the superior orbital rim. The  lateral injection was again in the upper one third of the forehead vertically above the lateral limbus of the cornea, 1.5 cm lateral to the medial injection site.  -Temporalis muscle injection, 4 sites, bilaterally. The first injection was 3 cm above the tragus of the ear, second injection site was 1.5 cm to 3 cm up from the first injection site in line with the tragus of the ear. The third injection site was 1.5-3 cm forward between the first 2 injection sites. The fourth injection site was 1.5 cm posterior to the second injection site.   -Occipitalis muscle injection, 3 sites, bilaterally. The first injection was done one half way between the occipital protuberance and the tip of the mastoid process behind the ear. The second injection site was done lateral and superior to the first, 1 fingerbreadth from the first injection. The third injection site was 1 fingerbreadth superiorly and medially from the first injection site.  -Cervical paraspinal muscle injection, 2 sites, bilateral knee first injection site was 1 cm from the midline of the cervical spine, 3 cm inferior to the lower border of the occipital protuberance. The second injection site was 1.5 cm superiorly and laterally to the first injection site.  -Trapezius muscle injection was performed at 3 sites, bilaterally. The first injection site was in the upper trapezius muscle halfway between the inflection point of the neck, and the acromion. The second injection site was one half way between the acromion and the first injection site. The third injection was done between the first injection site and the inflection point of the neck.   Will return for repeat injection in 3 months.   155 units of Botox was used, 45 Botox not injected was wasted. The patient tolerated the procedure well, there were no complications of the above procedure.

## 2024-03-23 NOTE — Progress Notes (Signed)
 Botox- 200 units x 1 vial Lot: [id number] Expiration: 03/2026 NDC: 4540-9811-91  Bacteriostatic 0.9% Sodium Chloride- * mL  Lot: [id number] Expiration: 10/17/2024 NDC: 6213-0865-78  Dx: I69.629 S/P  Witnessed by Hermenia Fiscal, RN

## 2024-04-06 ENCOUNTER — Encounter: Admitting: Internal Medicine

## 2024-06-08 ENCOUNTER — Other Ambulatory Visit: Payer: Self-pay

## 2024-06-08 NOTE — Progress Notes (Signed)
 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

## 2024-06-15 ENCOUNTER — Ambulatory Visit: Admitting: Neurology

## 2024-06-25 ENCOUNTER — Ambulatory Visit: Admitting: Neurology

## 2024-06-25 VITALS — BP 108/73 | HR 84

## 2024-06-25 DIAGNOSIS — G43709 Chronic migraine without aura, not intractable, without status migrainosus: Secondary | ICD-10-CM | POA: Diagnosis not present

## 2024-06-25 MED ORDER — ONABOTULINUMTOXINA 200 UNITS IJ SOLR
155.0000 [IU] | Freq: Once | INTRAMUSCULAR | Status: AC
  Administered 2024-06-25: 165 [IU] via INTRAMUSCULAR

## 2024-06-25 NOTE — Progress Notes (Signed)
 Botox - 200 units x 1 vial Lot: [id number] Expiration: 10/2026 NDC: 9976-6078-97   Bacteriostatic 0.9% Sodium Chloride - 4mL  Lot: [id number] Expiration: 10/16/2025 NDC: 9590-8033-97   Dx: H56.290 S/P Witnessed Briant Radon CMA

## 2024-06-25 NOTE — Progress Notes (Signed)
 Consent Form Botulism Toxin Injection For Chronic Migraine  06/25/2024: stable, still doing well. Patient feels that her migraines cause her jaw to ache in her jaw aching also can cause her migraines to worsen it is a trigger for migraines included 5 units in each masseter to see if that helps with migraine severity.>80% improvement in migraine frequency and severity.  03/23/2024: stable, still doing well. Patient feels that her migraines cause her jaw to ache in her jaw aching also can cause her migraines to worsen it is a trigger for migraines included 5 units in each masseter to see if that helps with migraine severity.  12/30/2023: >80% improvement in migraine frequency and severity.Patient feels that her migraines cause her jaw to ache in her jaw aching also can cause her migraines to worsen it is a trigger for migraines included 5 units in each masseter to see if that helps with migraine severity.  09/18/2023: stable 06/26/2023: Stable >80% improvement in migraine frequency and severity. 04/02/2023: stable 11/01/2022: stable 07/03/2022: stable 02/16/2022: Stable >80% improvement in migraine frequency and severity.  Reviewed orally with patient, additionally signature is on file:  Botulism toxin has been approved by the Federal drug administration for treatment of chronic migraine. Botulism toxin does not cure chronic migraine and it may not be effective in some patients.  The administration of botulism toxin is accomplished by injecting a small amount of toxin into the muscles of the neck and head. Dosage must be titrated for each individual. Any benefits resulting from botulism toxin tend to wear off after 3 months with a repeat injection required if benefit is to be maintained. Injections are usually done every 3-4 months with maximum effect peak achieved by about 2 or 3 weeks. Botulism toxin is expensive and you should be sure of what costs you will incur resulting from the injection.  The  side effects of botulism toxin use for chronic migraine may include:   -Transient, and usually mild, facial weakness with facial injections  -Transient, and usually mild, head or neck weakness with head/neck injections  -Reduction or loss of forehead facial animation due to forehead muscle weakness  -Eyelid drooping  -Dry eye  -Pain at the site of injection or bruising at the site of injection  -Double vision  -Potential unknown long term risks  Contraindications: You should not have Botox  if you are pregnant, nursing, allergic to albumin, have an infection, skin condition, or muscle weakness at the site of the injection, or have myasthenia gravis, Lambert-Eaton syndrome, or ALS.  It is also possible that as with any injection, there may be an allergic reaction or no effect from the medication. Reduced effectiveness after repeated injections is sometimes seen and rarely infection at the injection site may occur. All care will be taken to prevent these side effects. If therapy is given over a long time, atrophy and wasting in the muscle injected may occur. Occasionally the patient's become refractory to treatment because they develop antibodies to the toxin. In this event, therapy needs to be modified.  I have read the above information and consent to the administration of botulism toxin.    BOTOX  PROCEDURE NOTE FOR MIGRAINE HEADACHE    Contraindications and precautions discussed with patient(above). Aseptic procedure was observed and patient tolerated procedure. Procedure performed by Dr. Andree Epp  The condition has existed for more than 6 months, and pt does not have a diagnosis of ALS, Myasthenia Gravis or Lambert-Eaton Syndrome.  Risks and benefits of injections discussed and  pt agrees to proceed with the procedure.  Written consent obtained  These injections are medically necessary. Pt  receives good benefits from these injections. These injections do not cause sedations or  hallucinations which the oral therapies may cause.  Description of procedure:  The patient was placed in a sitting position. The standard protocol was used for Botox  as follows, with 5 units of Botox  injected at each site:   -Procerus muscle, midline injection  -Corrugator muscle, bilateral injection  -Frontalis muscle, bilateral injection, with 2 sites each side, medial injection was performed in the upper one third of the frontalis muscle, in the region vertical from the medial inferior edge of the superior orbital rim. The lateral injection was again in the upper one third of the forehead vertically above the lateral limbus of the cornea, 1.5 cm lateral to the medial injection site.  -Temporalis muscle injection, 4 sites, bilaterally. The first injection was 3 cm above the tragus of the ear, second injection site was 1.5 cm to 3 cm up from the first injection site in line with the tragus of the ear. The third injection site was 1.5-3 cm forward between the first 2 injection sites. The fourth injection site was 1.5 cm posterior to the second injection site.   -Occipitalis muscle injection, 3 sites, bilaterally. The first injection was done one half way between the occipital protuberance and the tip of the mastoid process behind the ear. The second injection site was done lateral and superior to the first, 1 fingerbreadth from the first injection. The third injection site was 1 fingerbreadth superiorly and medially from the first injection site.  -Cervical paraspinal muscle injection, 2 sites, bilateral knee first injection site was 1 cm from the midline of the cervical spine, 3 cm inferior to the lower border of the occipital protuberance. The second injection site was 1.5 cm superiorly and laterally to the first injection site.  -Trapezius muscle injection was performed at 3 sites, bilaterally. The first injection site was in the upper trapezius muscle halfway between the inflection point of  the neck, and the acromion. The second injection site was one half way between the acromion and the first injection site. The third injection was done between the first injection site and the inflection point of the neck.   Will return for repeat injection in 3 months.   165 units of Botox  was used, 35 Botox  not injected was wasted. The patient tolerated the procedure well, there were no complications of the above procedure.

## 2024-07-05 ENCOUNTER — Encounter: Payer: Self-pay | Admitting: Internal Medicine

## 2024-07-05 DIAGNOSIS — R29898 Other symptoms and signs involving the musculoskeletal system: Secondary | ICD-10-CM

## 2024-07-06 ENCOUNTER — Ambulatory Visit: Payer: Self-pay | Admitting: Internal Medicine

## 2024-07-06 ENCOUNTER — Ambulatory Visit (HOSPITAL_COMMUNITY)
Admission: RE | Admit: 2024-07-06 | Discharge: 2024-07-06 | Disposition: A | Discharge status: Home / Self Care | Source: Ambulatory Visit | Attending: Internal Medicine | Admitting: Internal Medicine

## 2024-07-06 DIAGNOSIS — R29898 Other symptoms and signs involving the musculoskeletal system: Secondary | ICD-10-CM | POA: Insufficient documentation

## 2024-07-06 DIAGNOSIS — M5416 Radiculopathy, lumbar region: Secondary | ICD-10-CM

## 2024-07-12 ENCOUNTER — Ambulatory Visit
Admission: RE | Admit: 2024-07-12 | Discharge: 2024-07-12 | Disposition: A | Discharge status: Home / Self Care | Source: Ambulatory Visit | Attending: Internal Medicine | Admitting: Internal Medicine

## 2024-07-12 DIAGNOSIS — M5416 Radiculopathy, lumbar region: Secondary | ICD-10-CM

## 2024-07-12 DIAGNOSIS — M79604 Pain in right leg: Secondary | ICD-10-CM | POA: Diagnosis not present

## 2024-07-12 DIAGNOSIS — M545 Low back pain, unspecified: Secondary | ICD-10-CM | POA: Diagnosis not present

## 2024-07-14 ENCOUNTER — Ambulatory Visit: Payer: Self-pay | Admitting: Internal Medicine

## 2024-07-27 ENCOUNTER — Encounter: Payer: Self-pay | Admitting: Internal Medicine

## 2024-08-21 ENCOUNTER — Other Ambulatory Visit (HOSPITAL_COMMUNITY): Payer: Self-pay

## 2024-09-07 ENCOUNTER — Other Ambulatory Visit: Payer: Self-pay

## 2024-09-07 NOTE — Progress Notes (Signed)
 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   Copay: $0.00 Appointment: 10.02.25

## 2024-09-10 ENCOUNTER — Other Ambulatory Visit: Payer: Self-pay

## 2024-09-17 ENCOUNTER — Telehealth: Payer: Self-pay | Admitting: Neurology

## 2024-09-17 ENCOUNTER — Ambulatory Visit: Admitting: Neurology

## 2024-09-17 NOTE — Telephone Encounter (Signed)
 Pt gets Botox  for migraines, should see MD per [facility]. R/s for 10/15 @ 11:15 am with Dr. Buck.

## 2024-09-17 NOTE — Telephone Encounter (Signed)
 Patient calling to reschedule Botox  appointment with Dr. Ines since she is no longer at [facility]

## 2024-09-21 ENCOUNTER — Ambulatory Visit: Admitting: Neurology

## 2024-09-30 ENCOUNTER — Ambulatory Visit: Admitting: Neurology

## 2024-09-30 VITALS — BP 94/59 | HR 83

## 2024-09-30 DIAGNOSIS — G43709 Chronic migraine without aura, not intractable, without status migrainosus: Secondary | ICD-10-CM

## 2024-09-30 MED ORDER — ONABOTULINUMTOXINA 200 UNITS IJ SOLR: 155.0000 [IU] | Freq: Once | INTRAMUSCULAR | Status: AC

## 2024-09-30 NOTE — Progress Notes (Signed)
 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  The patient is unaccompanied today and had her last injection through this office in July under Dr. Ines.  She continues to have good success with these injections but does require occasional rescue medication.  Please see prior encounters with [facility] for full history and medications tried and failed for migraines in the past.   She has previously been consented for this procedure and we will have her sign a consent each time.    I talked to the patient in detail about expectations, limitations, benefits as well as potential adverse effects of botulinum toxin injections. The patient understands that the side effects include (but are not limited to): Mouth dryness, dryness of eyes, speech and swallowing difficulties, respiratory depression or problems breathing, weakness of muscles including more distant muscles than the ones injected, flu-like symptoms, myalgias, injection site reactions such as redness, itching, swelling, pain, and infection.  200 units of botulinum toxin type A  were reconstituted using preservative-free normal saline to a concentration of 10 units per 0.1 mL and drawn up into 1 mL tuberculin syringes. Lot numbers and expiration dates as below.   Today, 09/30/2024: The patient reports good results with her Botox  last time but in the past couple of weeks she has noticed a recurrence of her migraine and in fact she took some Excedrin early this morning around 6 AM and also took sumatriptan.  She rarely has to take a triptan.  Stress is definitely a trigger for her migraines.  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  Of note, she is requesting that her frontalis injections be done a little  further back and also the corrugator injection higher than her eyebrows as she had droopy eyebrows in the past from these injections.  Other than that, we will follow protocol for Botox  injections.  The patient was situated in a chair, sitting comfortably. After preparing the areas with 70% isopropyl alcohol and using a 26 gauge 1 1/2 inch hollow lumen recording EMG needle for the neck injections as well as a 30 gauge 1 inch needle for the facial injections, a total dose of 155 units of botulinum toxin type A  in the form of Botox  was injected into the muscles and the following distribution and quantities:  #1: 10 units on the right and 10 units in the left frontalis muscles, broken down in 2 sites on each side. #2: 5 units in the right and 5 units in the left corrugator muscles. #3: 15 units in the right and 15 units in the left occipitalis muscles, broken down in 3 sites on each side. #4: 20 units in the right and 20 units in the left temporalis muscles, broken down in 4 sites on each side.  #5: 15 units on the right and 15 units in the left upper trapezius muscles, broken down in 3 sites on each side. #6: 10 units in the right and 10 units in the left splenius capitis muscles, broken down in 2 sites on each side.  #7: 2.5 units in the right and 2.5 units in the left procerus muscles.  The patient has in the past received masseter injections but I do not do these injections and they are not part of the Botox   protocol for migraines.  EMG guidance was utilized for the neck injections with mild EMG activity noted, especially in the splenius capitis muscle on the right side.   A dose of 45 units out of a total dose of 200 units was discarded as unavoidable waste.    Botox - 200 units x 1 vial Lot: [id number] Expiration: 10/2026 NDC: 9976-6078-97   Bacteriostatic 0.9% Sodium Chloride - 2.2 mL  Lot: [id number] Expiration: 09/2025 NDC: 9590-8033-97   Dx: H56.290  S/P  Witnessed by Diandra,CMA  The  patient tolerated the procedure well without immediate complications. She was advised to make a followup appointment for repeat injections in 3 months from now and encouraged to call us  with any interim questions, concerns, problems, or updates. She was in agreement and did not have any questions prior to leaving clinic today.

## 2024-10-01 ENCOUNTER — Ambulatory Visit: Admitting: Neurology

## 2024-10-09 ENCOUNTER — Encounter: Payer: Self-pay | Admitting: Internal Medicine

## 2024-10-13 ENCOUNTER — Encounter: Payer: Self-pay | Admitting: Internal Medicine

## 2024-10-13 NOTE — Patient Instructions (Signed)
 Blood work was ordered.

## 2024-10-14 ENCOUNTER — Ambulatory Visit: Admitting: Internal Medicine

## 2024-10-14 VITALS — BP 106/72 | HR 68 | Temp 98.1°F | Ht 62.5 in | Wt 115.0 lb

## 2024-10-14 DIAGNOSIS — N951 Menopausal and female climacteric states: Secondary | ICD-10-CM | POA: Diagnosis not present

## 2024-10-14 DIAGNOSIS — N912 Amenorrhea, unspecified: Secondary | ICD-10-CM

## 2024-10-14 DIAGNOSIS — E038 Other specified hypothyroidism: Secondary | ICD-10-CM

## 2024-10-14 LAB — LUTEINIZING HORMONE: LH: 3.11 m[IU]/mL

## 2024-10-14 LAB — FOLLICLE STIMULATING HORMONE: FSH: 1.9 m[IU]/mL

## 2024-10-14 LAB — TSH: TSH: 2.19 u[IU]/mL (ref 0.35–5.50)

## 2024-10-14 NOTE — Assessment & Plan Note (Signed)
 New Experiencing fatigue, aches and pains, brain fog, vaginal dryness and low libido Denies any vasomotor symptoms Mom went through early menopause and feels she might be perimenopausal if not menopausal Has Mirena  IUD Check estradiol, LH, FSH, TSH and AMH Would like to consider low-dose estrogen patch based on blood work

## 2024-10-14 NOTE — Assessment & Plan Note (Signed)
 Chronic  Clinically euthyroid Having perimenopause symptoms Currently taking levothyroxine  75 mcg daily Check tsh

## 2024-10-14 NOTE — Progress Notes (Signed)
 Subjective:    Patient ID: Cynthia Distance, MD, female    DOB: 07/06/1976, 48 y.o.   MRN: [id number]      HPI Cynthia Ramsey is here for  Chief Complaint  Patient presents with   Menopause    Mom - early menopause, OP  Has mirena , so does not have periods.    Always tired.  Aches and pains.   No vasomotor symptoms.   Brain fog.  Vaginal dryness, low libido   Feels she may be in perimenopause or menopause.      Medications and allergies reviewed with patient and updated if appropriate.  Current Outpatient Medications on File Prior to Visit  Medication Sig Dispense Refill   botulinum toxin Type A  (BOTOX ) 200 units injection Provider to inject 155 units into the muscles of the head and neck every 12 weeks. Discard remainder. 1 each 3   Cholecalciferol (VITAMIN D ) 2000 UNITS tablet Take 1 tablet (2,000 Units total) by mouth daily. (Patient taking differently: Take 5,000 Units by mouth every other day.) 30 tablet 11   levothyroxine  (SYNTHROID ) 75 MCG tablet Take 1 tablet (75 mcg total) by mouth daily before breakfast. 90 tablet 3   linaclotide  (LINZESS ) 290 MCG CAPS capsule Take 1 capsule (290 mcg total) by mouth 30 minutes before the first meal of the day. 90 capsule 3   loratadine (CLARITIN) 10 MG tablet Take 10 mg by mouth daily as needed for allergies or rhinitis.     Multiple Vitamin (MULTIVITAMIN) tablet Take 1 tablet by mouth daily.     ondansetron  (ZOFRAN ) 4 MG tablet Take 1 tablet (4 mg total) by mouth every 8 (eight) hours as needed for nausea. 20 tablet 0   rizatriptan  (MAXALT -MLT) 10 MG disintegrating tablet Take 1 tablet (10 mg total) by mouth as needed for migraine. May repeat in 2 hours if needed 12 tablet 12   tacrolimus  (PROTOPIC ) 0.1 % ointment Apply 1 application (a small amount) topically to skin 2 (two) times daily as needed. 30 g 0   tacrolimus  (PROTOPIC ) 0.1 % ointment Apply small amount to skin 2 (two) times daily as needed, for flare ups 30 g 2   tretinoin   (RETIN-A ) 0.025 % cream Apply topically at bedtime. 45 g 3   Urelle  (URELLE /URISED) 81 MG TABS tablet Take 1 tablet (81 mg total) by mouth every 6 (six) hours as needed for bladder spasms. 40 tablet 0   valACYclovir  (VALTREX ) 500 MG tablet Take 1 tablet (500 mg total) by mouth daily for suppression or twice daily for 3 days with sex. 100 tablet 1   Current Facility-Administered Medications on File Prior to Visit  Medication Dose Route Frequency Provider Last Rate Last Admin   botulinum toxin Type A  (BOTOX ) injection 155 Units  155 Units Intramuscular Once Ahern, Antonia B, MD       botulinum toxin Type A  (BOTOX ) injection 155 Units  155 Units Intramuscular Once Ahern, Antonia B, MD       botulinum toxin Type A  (BOTOX ) injection 155 Units  155 Units Intramuscular Once Athar, Saima, MD        Review of Systems     Objective:   Vitals:   10/14/24 1315  BP: 106/72  Pulse: 68  Temp: 98.1 F (36.7 C)  SpO2: 98%   BP Readings from Last 3 Encounters:  10/14/24 106/72  09/30/24 (!) 94/59  06/25/24 108/73   Wt Readings from Last 3 Encounters:  10/14/24 115 lb (52.2 kg)  03/20/24 116  lb (52.6 kg)  01/10/24 121 lb 0.5 oz (54.9 kg)   Body mass index is 20.7 kg/m.    Physical Exam Constitutional:      General: She is not in acute distress.    Appearance: Normal appearance. She is not ill-appearing.  HENT:     Head: Normocephalic and atraumatic.  Skin:    General: Skin is warm and dry.  Neurological:     Mental Status: She is alert. Mental status is at baseline.  Psychiatric:        Mood and Affect: Mood normal.        Behavior: Behavior normal.        Thought Content: Thought content normal.        Judgment: Judgment normal.            Assessment & Plan:    See Problem List for Assessment and Plan of chronic medical problems.

## 2024-10-17 LAB — ESTRADIOL: Estradiol: 173 pg/mL

## 2024-10-17 LAB — ANTI-MULLERIAN HORMONE (AMH), FEMALE: Anti-Mullerian Hormones(AMH), Female: 0.21 ng/mL

## 2024-10-18 ENCOUNTER — Encounter: Payer: Self-pay | Admitting: Internal Medicine

## 2024-10-18 ENCOUNTER — Ambulatory Visit: Payer: Self-pay | Admitting: Internal Medicine

## 2024-10-27 ENCOUNTER — Telehealth: Payer: Self-pay | Admitting: Neurology

## 2024-10-27 NOTE — Telephone Encounter (Signed)
-----   Message from Nurse Heather C sent at 10/27/2024  8:48 AM EST ----- Regarding: FW: request to change provider for Botox   ----- Message ----- From: Onita Duos, MD Sent: 10/26/2024   5:22 PM EST To: True Mar, MD; Olam CHRISTELLA Lather; [facility] Calls Subject: RE: request to change provider for Botox        Ok to put patient on my schedule ----- Message ----- From: Mar True, MD Sent: 10/26/2024  10:09 AM EST To: Duos Onita, MD; Olam CHRISTELLA Lather Subject: RE: request to change provider for Botox        Ok with me.  sa ----- Message ----- From: Lather Olam CHRISTELLA Sent: 10/26/2024   9:16 AM EST To: Duos Onita, MD; True Mar, MD Subject: request to change provider for Botox            Pt is asking that she be transferred from Dr Mar to Dr Onita, pt was told the request would be submitted, and that she will be contacted once a decision has been made.

## 2024-10-27 NOTE — Telephone Encounter (Signed)
 Called and r/s Botox  to 01/06/25 @ 1:40 pm with Dr. Onita.

## 2024-11-16 DIAGNOSIS — H25041 Posterior subcapsular polar age-related cataract, right eye: Secondary | ICD-10-CM | POA: Diagnosis not present

## 2024-11-16 DIAGNOSIS — Z961 Presence of intraocular lens: Secondary | ICD-10-CM | POA: Diagnosis not present

## 2024-11-26 ENCOUNTER — Other Ambulatory Visit: Payer: Self-pay

## 2024-12-02 ENCOUNTER — Other Ambulatory Visit (HOSPITAL_COMMUNITY): Payer: Self-pay

## 2024-12-02 ENCOUNTER — Other Ambulatory Visit: Payer: Self-pay

## 2024-12-02 ENCOUNTER — Other Ambulatory Visit: Payer: Self-pay | Admitting: Internal Medicine

## 2024-12-02 MED ORDER — ESTRADIOL 0.025 MG/24HR TD PTTW
1.0000 | MEDICATED_PATCH | TRANSDERMAL | 12 refills | Status: AC
  Filled 2024-12-02: qty 8, 28d supply, fill #0
  Filled 2025-01-01: qty 8, 28d supply, fill #1

## 2024-12-21 ENCOUNTER — Other Ambulatory Visit: Payer: Self-pay

## 2024-12-21 ENCOUNTER — Other Ambulatory Visit (HOSPITAL_COMMUNITY): Payer: Self-pay

## 2024-12-22 ENCOUNTER — Other Ambulatory Visit (HOSPITAL_COMMUNITY): Payer: Self-pay

## 2024-12-22 ENCOUNTER — Other Ambulatory Visit: Payer: Self-pay

## 2024-12-23 ENCOUNTER — Other Ambulatory Visit (HOSPITAL_COMMUNITY): Payer: Self-pay

## 2024-12-25 ENCOUNTER — Other Ambulatory Visit: Payer: Self-pay | Admitting: Internal Medicine

## 2024-12-25 DIAGNOSIS — Z1231 Encounter for screening mammogram for malignant neoplasm of breast: Secondary | ICD-10-CM

## 2024-12-31 ENCOUNTER — Other Ambulatory Visit: Payer: Self-pay

## 2024-12-31 NOTE — Progress Notes (Signed)
 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

## 2025-01-01 ENCOUNTER — Other Ambulatory Visit (HOSPITAL_COMMUNITY): Payer: Self-pay

## 2025-01-01 ENCOUNTER — Other Ambulatory Visit: Payer: Self-pay

## 2025-01-02 ENCOUNTER — Other Ambulatory Visit (HOSPITAL_COMMUNITY): Payer: Self-pay

## 2025-01-06 ENCOUNTER — Ambulatory Visit
Admission: RE | Admit: 2025-01-06 | Discharge: 2025-01-06 | Disposition: A | Source: Ambulatory Visit | Attending: Internal Medicine

## 2025-01-06 ENCOUNTER — Ambulatory Visit: Admitting: Neurology

## 2025-01-06 VITALS — BP 95/65 | HR 90

## 2025-01-06 DIAGNOSIS — G43709 Chronic migraine without aura, not intractable, without status migrainosus: Secondary | ICD-10-CM

## 2025-01-06 DIAGNOSIS — Z1231 Encounter for screening mammogram for malignant neoplasm of breast: Secondary | ICD-10-CM

## 2025-01-06 MED ORDER — ONABOTULINUMTOXINA 200 UNITS IJ SOLR: 200.0000 [IU] | Freq: Once | INTRAMUSCULAR | Status: AC

## 2025-01-06 NOTE — Progress Notes (Unsigned)
 Botox - 200 units x 1 vial Onu:[id number] Expiration: 02/2027 NDC: 0023-3921-02  Bacteriostatic 0.9% Sodium Chloride - 4 mL  Lot: [id number] Expiration: OCT-31-2026 NDC: 9590-8033-97  Dx:G43.709 S/P Witnessed by; Buddy Skeeters, CMA

## 2025-01-06 NOTE — Progress Notes (Unsigned)
 "  Chief Complaint  Patient presents with   Follow-up    Pt in room 15. Alone. Here for botox  injection for migraines.       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 Bilateral temporalis 8 injection sites Bilateral occipitalis 6 injection sites Bilateral cervical paraspinals 4 injection sites Bilateral upper trapezius 6 injection sites  Extra 45 unites were injected into upper cervical paraspinal muscles, and masseters   DIAGNOSTIC DATA (LABS, IMAGING, TESTING) - I reviewed patient records, labs, notes, testing and imaging myself where available.   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  Her primary care is Dr. Geofm, Glade     History is obtained from the patient and review of electronic medical records. I personally reviewed pertinent available imaging films in PACS.   PMHx of  Hypothyroidism Chronic constipation IBS  She had a long history of chronic migraines since high school, typical migraine are retro-orbital area severe pounding headache with light, noise sensitivity, nauseous  For a while she was having frequent migraine headaches, tried and failed with different medications,  Preventive: Betablocker -low, Topamax --, slow reaction, falling off, ajovy,--  Could nto get up from chair, pxoimal muscle weakness,   Abortive treatment: Maxalt , but often cause sleepiness,   Started BOTOX  as migraine prevention since 2020, did very well, rarely have headaches, responding to Excedrin Migraine PHYSICAL EXAM:   Vitals:   01/06/25 1340   BP: 95/65  Pulse: 90   There is no height or weight on file to calculate BMI.  PHYSICAL EXAMNIATION:  Gen: NAD, conversant, well nourised, well groomed                     Cardiovascular: Regular rate rhythm, no peripheral edema, warm, nontender. Eyes: Conjunctivae clear without exudates or hemorrhage Neck: Supple, no carotid bruits. Pulmonary: Clear to auscultation bilaterally   NEUROLOGICAL EXAM:  MENTAL STATUS: Speech/cognition: Awake, alert, oriented to history taking and casual conversation CRANIAL NERVES: CN II: Visual fields are full to confrontation. Pupils are round equal and briskly reactive to light. CN III, IV, VI: extraocular movement are normal. No ptosis. CN V: Facial sensation is intact to light touch CN VII: Face is symmetric with normal eye closure  CN VIII: Hearing is normal to causal conversation. CN IX, X: Phonation is normal. CN XI: Head turning and shoulder shrug are intact  MOTOR: There is no pronator drift of out-stretched arms. Muscle bulk and tone are normal. Muscle strength is normal.  REFLEXES: Reflexes are 2+ and symmetric at the biceps, triceps, knees, and ankles. Plantar responses are flexor.  SENSORY: Intact to light touch, pinprick and vibratory sensation are intact in fingers and toes.  COORDINATION: There is no trunk or limb dysmetria noted.  GAIT/STANCE: Posture is normal. Gait is steady  REVIEW OF SYSTEMS:  Full 14 system review of systems performed and notable only for as above All other review of systems were negative.   ALLERGIES: Allergies[1]  HOME MEDICATIONS: Current Outpatient Medications  Medication Sig Dispense Refill   botulinum toxin Type A  (BOTOX ) 200 units injection  Provider to inject 155 units into the muscles of the head and neck every 12 weeks. Discard remainder. 1 each 3   Cholecalciferol (VITAMIN D ) 2000 UNITS tablet Take 1 tablet (2,000 Units total) by mouth daily. (Patient taking differently: Take 5,000  Units by mouth every other day.) 30 tablet 11   estradiol  (VIVELLE -DOT) 0.025 MG/24HR Place 1 patch onto the skin 2 (two) times a week. 8 patch 12   levothyroxine  (SYNTHROID ) 75 MCG tablet Take 1 tablet (75 mcg total) by mouth daily before breakfast. 90 tablet 3   linaclotide  (LINZESS ) 290 MCG CAPS capsule Take 1 capsule (290 mcg total) by mouth 30 minutes before the first meal of the day. 90 capsule 3   loratadine (CLARITIN) 10 MG tablet Take 10 mg by mouth daily as needed for allergies or rhinitis.     Multiple Vitamin (MULTIVITAMIN) tablet Take 1 tablet by mouth daily.     ondansetron  (ZOFRAN ) 4 MG tablet Take 1 tablet (4 mg total) by mouth every 8 (eight) hours as needed for nausea. 20 tablet 0   rizatriptan  (MAXALT -MLT) 10 MG disintegrating tablet Take 1 tablet (10 mg total) by mouth as needed for migraine. May repeat in 2 hours if needed 12 tablet 12   tacrolimus  (PROTOPIC ) 0.1 % ointment Apply 1 application (a small amount) topically to skin 2 (two) times daily as needed. 30 g 0   tacrolimus  (PROTOPIC ) 0.1 % ointment Apply small amount to skin 2 (two) times daily as needed, for flare ups 30 g 2   tretinoin  (RETIN-A ) 0.025 % cream Apply topically at bedtime. 45 g 3   Urelle  (URELLE /URISED) 81 MG TABS tablet Take 1 tablet (81 mg total) by mouth every 6 (six) hours as needed for bladder spasms. 40 tablet 0   valACYclovir  (VALTREX ) 500 MG tablet Take 1 tablet (500 mg total) by mouth daily for suppression or twice daily for 3 days with sex. 100 tablet 1   Current Facility-Administered Medications  Medication Dose Route Frequency Provider Last Rate Last Admin   botulinum toxin Type A  (BOTOX ) injection 155 Units  155 Units Intramuscular Once Ines Onetha NOVAK, MD       botulinum toxin Type A  (BOTOX ) injection 155 Units  155 Units Intramuscular Once Ahern, Antonia B, MD       botulinum toxin Type A  (BOTOX ) injection 155 Units  155 Units Intramuscular Once Athar, Saima, MD       botulinum toxin Type  A (BOTOX ) injection 200 Units  200 Units Intramuscular Once Onita Duos, MD        PAST MEDICAL HISTORY: Past Medical History:  Diagnosis Date   ALLERGIC RHINITIS    Headache(784.0)    migraines   HYPOTHYROIDISM    Hashimoto's history    PAST SURGICAL HISTORY: Past Surgical History:  Procedure Laterality Date   BREAST BIOPSY Right 04/27/2016   benign   BREAST BIOPSY Left 04/27/2016   benign   BREAST BIOPSY Right 12/05/2023   US  RT BREAST BX W LOC DEV 1ST LESION IMG BX SPEC US  GUIDE 12/05/2023 [facility]   BREAST BIOPSY Left 01/09/2024   US  LT RADIOACTIVE SEED LOC 01/09/2024 [facility]   BREAST CYST ASPIRATION Left 11/2023   BREAST EXCISIONAL BIOPSY Left 12/2023   Child birth  06 & 09   x's 2    OTHER SURGICAL HISTORY Left 12/2021   posterior subcapsular cataract   RADIOACTIVE SEED GUIDED EXCISIONAL BREAST BIOPSY Left 01/10/2024   Procedure: LEFT BREAST SEED GUIDED EXCISIONAL  BIOPSY;  Surgeon: Ebbie Cough, MD;  Location: [facility];  Service: General;  Laterality: Left;  LMA   RHINOPLASTY  2012   deviated septum    FAMILY HISTORY: Family History  Problem Relation Age of Onset   Stroke Mother    Hyperlipidemia Mother    Hypertension Mother    Heart disease Father    Atrial fibrillation Father    Migraines Neg Hx     SOCIAL HISTORY: Social History   Socioeconomic History   Marital status: Married    Spouse name: Not on file   Number of children: 2   Years of education: Not on file   Highest education level: Not on file  Occupational History   Occupation: Physician  Tobacco Use   Smoking status: Never   Smokeless tobacco: Never   Tobacco comments:    Married, lives with spouse. employed as hospitalist for [employer] Use   Vaping status: Never Used  Substance and Sexual Activity   Alcohol use: Not Currently   Drug use: No   Sexual activity: Not on file  Other Topics Concern   Not on file  Social History Narrative    Lives with husband and kids   Caffeine use: 1 cup coffee on work days   Tea twice daily   Social Drivers of Health   Tobacco Use: Low Risk (10/13/2024)   Patient History    Smoking Tobacco Use: Never    Smokeless Tobacco Use: Never    Passive Exposure: Not on file  Financial Resource Strain: Not on file  Food Insecurity: Not on file  Transportation Needs: Not on file  Physical Activity: Not on file  Stress: Not on file  Social Connections: Not on file  Intimate Partner Violence: Not on file  Depression (PHQ2-9): Low Risk (10/14/2024)   Depression (PHQ2-9)    PHQ-2 Score: 2  Alcohol Screen: Not on file  Housing: Unknown (01/28/2024)   Received from [facility]   Epic    Unable to Pay for Housing in the Last Year: Not on file    Number of Times Moved in the Last Year: Not on file    At any time in the past 12 months, were you homeless or living in a shelter (including now)?: No  Utilities: Not on file  Health Literacy: Not on file      Modena Callander, M.D. Ph.D.  [facility] [address], [address] [place], [place] [address] Ph: [phone] Fax: [phone]  CC:  Geofm Glade PARAS, MD [address] [place],  [place] [address]  Geofm Glade PARAS, MD       [1] No Known Allergies  "

## 2025-01-12 ENCOUNTER — Ambulatory Visit: Admitting: Neurology

## 2025-03-31 ENCOUNTER — Ambulatory Visit: Admitting: Neurology
